# Patient Record
Sex: Female | Born: 1945 | Race: White | Hispanic: No | Smoking: Former smoker
Health system: Southern US, Community
[De-identification: ages and names within clinical notes are randomized; demographics above are authoritative.]

## PROBLEM LIST (undated history)

## (undated) DIAGNOSIS — E8801 Alpha-1-antitrypsin deficiency: Secondary | ICD-10-CM

## (undated) DIAGNOSIS — R413 Other amnesia: Secondary | ICD-10-CM

## (undated) DIAGNOSIS — I10 Essential (primary) hypertension: Secondary | ICD-10-CM

## (undated) HISTORY — PX: ELBOW SURGERY: SHX618

## (undated) HISTORY — PX: PORT WINE STAIN REMOVAL W/ LASER: SHX2245

## (undated) HISTORY — DX: Other amnesia: R41.3

## (undated) HISTORY — PX: APPENDECTOMY: SHX54

## (undated) HISTORY — DX: Alpha-1-antitrypsin deficiency: E88.01

---

## 2017-01-12 ENCOUNTER — Ambulatory Visit: Payer: Medicare Other

## 2017-01-12 ENCOUNTER — Other Ambulatory Visit (HOSPITAL_BASED_OUTPATIENT_CLINIC_OR_DEPARTMENT_OTHER): Payer: Medicare Other

## 2017-01-12 DIAGNOSIS — D508 Other iron deficiency anemias: Secondary | ICD-10-CM | POA: Diagnosis not present

## 2017-01-12 DIAGNOSIS — E8801 Alpha-1-antitrypsin deficiency: Secondary | ICD-10-CM | POA: Diagnosis not present

## 2017-01-12 LAB — COMPREHENSIVE METABOLIC PANEL
ALBUMIN: 4 g/dL (ref 3.5–5.0)
ALK PHOS: 65 U/L (ref 40–150)
ALT: 62 U/L — AB (ref 0–55)
ANION GAP: 9 meq/L (ref 3–11)
AST: 60 U/L — AB (ref 5–34)
BUN: 13.5 mg/dL (ref 7.0–26.0)
CALCIUM: 9.7 mg/dL (ref 8.4–10.4)
CHLORIDE: 106 meq/L (ref 98–109)
CO2: 27 mEq/L (ref 22–29)
CREATININE: 0.7 mg/dL (ref 0.6–1.1)
EGFR: 81 mL/min/{1.73_m2} — ABNORMAL LOW (ref >90)
Glucose: 97 mg/dl (ref 70–140)
Potassium: 4.2 mEq/L (ref 3.5–5.1)
Sodium: 142 mEq/L (ref 136–145)
TOTAL PROTEIN: 7.4 g/dL (ref 6.4–8.3)
Total Bilirubin: 0.49 mg/dL (ref 0.20–1.20)

## 2017-01-12 LAB — FERRITIN: FERRITIN: 157 ng/mL (ref 9–269)

## 2017-01-12 LAB — CBC WITH DIFFERENTIAL (CANCER CENTER ONLY)
BASO#: 0 10*3/uL (ref 0.0–0.2)
BASO%: 0.5 % (ref 0.0–2.0)
EOS%: 2.7 % (ref 0.0–7.0)
Eosinophils Absolute: 0.2 10*3/uL (ref 0.0–0.5)
HCT: 42.7 % (ref 34.8–46.6)
HGB: 14.5 g/dL (ref 11.6–15.9)
LYMPH#: 2.4 10*3/uL (ref 0.9–3.3)
LYMPH%: 38.6 % (ref 14.0–48.0)
MCH: 31.9 pg (ref 26.0–34.0)
MCHC: 34 g/dL (ref 32.0–36.0)
MCV: 94 fL (ref 81–101)
MONO#: 0.5 10*3/uL (ref 0.1–0.9)
MONO%: 8.1 % (ref 0.0–13.0)
NEUT#: 3.1 10*3/uL (ref 1.5–6.5)
NEUT%: 50.1 % (ref 39.6–80.0)
PLATELETS: 175 10*3/uL (ref 145–400)
RBC: 4.55 10*6/uL (ref 3.70–5.32)
RDW: 12.9 % (ref 11.1–15.7)
WBC: 6.2 10*3/uL (ref 3.9–10.0)

## 2017-01-12 LAB — IRON AND TIBC
%SAT: 33 % (ref 21–57)
Iron: 116 ug/dL (ref 41–142)
TIBC: 356 ug/dL (ref 236–444)
UIBC: 240 ug/dL (ref 120–384)

## 2017-01-12 LAB — LACTATE DEHYDROGENASE: LDH: 187 U/L (ref 125–245)

## 2017-01-13 ENCOUNTER — Ambulatory Visit (HOSPITAL_BASED_OUTPATIENT_CLINIC_OR_DEPARTMENT_OTHER): Payer: Medicare Other | Admitting: Hematology & Oncology

## 2017-01-13 ENCOUNTER — Encounter: Payer: Self-pay | Admitting: Hematology & Oncology

## 2017-01-13 VITALS — BP 167/65 | HR 66 | Temp 98.1°F | Resp 20 | Ht 61.5 in | Wt 112.0 lb

## 2017-01-13 DIAGNOSIS — E8801 Alpha-1-antitrypsin deficiency: Secondary | ICD-10-CM | POA: Diagnosis not present

## 2017-01-13 DIAGNOSIS — Z87891 Personal history of nicotine dependence: Secondary | ICD-10-CM | POA: Diagnosis not present

## 2017-01-13 DIAGNOSIS — R7989 Other specified abnormal findings of blood chemistry: Secondary | ICD-10-CM | POA: Diagnosis not present

## 2017-01-13 DIAGNOSIS — R634 Abnormal weight loss: Secondary | ICD-10-CM

## 2017-01-13 DIAGNOSIS — D510 Vitamin B12 deficiency anemia due to intrinsic factor deficiency: Secondary | ICD-10-CM

## 2017-01-13 DIAGNOSIS — D508 Other iron deficiency anemias: Secondary | ICD-10-CM

## 2017-01-13 HISTORY — DX: Alpha-1-antitrypsin deficiency: E88.01

## 2017-01-13 LAB — ERYTHROPOIETIN: ERYTHROPOIETIN: 5.5 m[IU]/mL (ref 2.6–18.5)

## 2017-01-13 NOTE — Progress Notes (Signed)
Referral MD  Reason for Referral: Alpha-1-antitrypsin deficiency -- weight loss  Chief Complaint  Patient presents with  . Initial Assessment    Alpha-1-antitrypsin defiency  : I am not feeling well. I am losing weight. Family doctor is not listening to me.  HPI: Veronica Meza is a very nice 71 year old white female. She has alpha-1 anti-trypsin (AAT) deficiency. She has a very strong family history of this. Her father had it. She has had several sisters have it who have died already. She is part of a registry.  Her phenotype is MZ.  She has a daughter who has is also.  The test and her family have been from liver disease, lymphoma, myeloma, and I think lung disease.  She is 71 years old. Most of her family members have died around that age. She is very nervous about her also having a problem that will lead to a short life. She apparently has lost about 16 pounds over the past several months. She does have thyroid issues. She is on Synthroid.  She used to smoke. She stopped about 4 years ago. She by has about a 40-pack-year history of tobacco use.  She does not drink.  There is no obvious occupational type exposures.  She feels that she may have some swollen lymph nodes. This is on her neck.  She had her last mammogram a few months ago. She thinks her last colonoscopy was about 5 years ago.  She is ever had any moles removed. Her ancestry is [place]. She has very fair skin.  She has had no obvious change in bowel or bladder habits. She gets fatigued quite easily.  She is not a vegetarian.  Overall, I said that her performance status is ECOG 1.   No past medical history on file.:  No past surgical history on file.:   Current Outpatient Prescriptions:  .  amLODipine (NORVASC) 5 MG tablet, 5 mg daily., Disp: , Rfl:  .  aspirin EC 81 MG tablet, Take 81 mg by mouth daily., Disp: , Rfl:  .  atorvastatin (LIPITOR) 40 MG tablet, Take 40 mg by mouth daily., Disp: , Rfl:  .   busPIRone (BUSPAR) 10 MG tablet, Take 10 mg by mouth 2 (two) times daily., Disp: , Rfl:  .  Calcium Carb-Cholecalciferol (CALCIUM-VITAMIN D) 500-200 MG-UNIT tablet, Take by mouth., Disp: , Rfl:  .  Co-Enzyme Q-10 30 MG CAPS, Take 30 mg by mouth daily., Disp: , Rfl:  .  cromolyn (OPTICROM) 4 % ophthalmic solution, PRN allergies., Disp: , Rfl:  .  DOCOSAHEXAENOIC ACID PO, Take by mouth daily., Disp: , Rfl:  .  levothyroxine (SYNTHROID, LEVOTHROID) 100 MCG tablet, 100 mcg daily., Disp: , Rfl:  .  lisinopril (PRINIVIL,ZESTRIL) 40 MG tablet, 40 mg daily., Disp: , Rfl:  .  loratadine (CLARITIN) 10 MG tablet, Take 10 mg by mouth., Disp: , Rfl:  .  mupirocin ointment (BACTROBAN) 2 %, Place 1 application into the nose., Disp: , Rfl:  .  Probiotic Product (PROBIOTIC-10 PO), Take by mouth., Disp: , Rfl: :  :  Allergies  Allergen Reactions  . Codeine Swelling  . Sulfa Antibiotics Itching and Nausea And Vomiting    Other reaction(s): Vomiting (intolerance) Other reaction(s): Vomiting (intolerance)  . Alendronate     Other reaction(s): GI Upset (intolerance)  . Erythromycin Base Nausea Only  . Hydrocodone-Acetaminophen Swelling  :  No family history on file.:  Social History   Social History  . Marital status: Married  Spouse name: N/A  . Number of children: N/A  . Years of education: N/A   Occupational History  . Not on file.   Social History Main Topics  . Smoking status: Former Smoker    Packs/day: 1.00    Years: 15.00    Types: Cigarettes    Quit date: 01/13/2010  . Smokeless tobacco: Never Used  . Alcohol use No  . Drug use: No  . Sexual activity: Not on file   Other Topics Concern  . Not on file   Social History Narrative  . No narrative on file  :  Pertinent items are noted in HPI.  Exam:  Well-developed and well-nourished white female in no obvious distress. Her vital signs show a temperature of 98.1. Pulse 65. Blood pressure 167/66. Weight is 112 pounds. Hent  exam shows no ocular or oral lesions. There are no palpable cervical or supraclavicular lymph nodes. I cannot palpate any obvious submandibular lymph nodes. Thyroid is nonpalpable. Lungs are clear bilaterally. Cardiac exam regular rate and rhythm with no murmurs, rubs or bruits. Abdomen is soft. She has good bowel sounds. There is no fluid wave. There is no palpable liver or spleen tip. Back exam shows no tenderness over the spine, ribs or hips. Extremities shows no clubbing, cyanosis or edema. Neurological exam shows no focal neurological deficits. Skin exam shows no rashes, ecchymoses or petechia.    Recent Labs  01/12/17 1055  WBC 6.2  HGB 14.5  HCT 42.7  PLT 175    Recent Labs  01/12/17 1055  NA 142  K 4.2  CO2 27  GLUCOSE 97  BUN 13.5  CREATININE 0.7  CALCIUM 9.7    Blood smear review:  None  Pathology: None     Assessment and Plan: Veronica Meza is a 71 year old white female. She has alpha-1 antitrypsin deficiency. She is heterozygous for this. She has the MZ phenotype.  I am not sure as to why she would be losing weight. IMs pot that she has smoked and does not have more in the way of lung problems. I'm sure that she has a pulmonologist that she follows.  I probably would do some baseline studies on her. I think she findings a CT of the neck to make sure there is no obvious adenopathy. I think she needs an ultrasound of her liver. Her LFTs are mildly elevated. I suppose this could be from the AAT.  I chest x-ray probably would not be a bad idea.  I am not sure why she has little stamina. I would think that her thyroid test probably need to be checked. I would imagine her family doctor could be doing this.  I just want to give her some peace of mind. She is thankful that we were able to see her to try to help her out. I know this is a difficult situation for her given the type of disease that she has and which there really is no treatment for. It looks like she has done  quite well overall with it.  I spent about an hour with she and her husband. They're both very nice.  I gave her a prayer blanket. She was very thankful for this.

## 2017-01-18 ENCOUNTER — Ambulatory Visit (HOSPITAL_BASED_OUTPATIENT_CLINIC_OR_DEPARTMENT_OTHER)
Admission: RE | Admit: 2017-01-18 | Discharge: 2017-01-18 | Disposition: A | Discharge status: Home / Self Care | Payer: Medicare Other | Source: Ambulatory Visit | Attending: Hematology & Oncology | Admitting: Hematology & Oncology

## 2017-01-18 ENCOUNTER — Encounter (HOSPITAL_BASED_OUTPATIENT_CLINIC_OR_DEPARTMENT_OTHER): Payer: Self-pay

## 2017-01-18 DIAGNOSIS — J432 Centrilobular emphysema: Secondary | ICD-10-CM | POA: Insufficient documentation

## 2017-01-18 DIAGNOSIS — R590 Localized enlarged lymph nodes: Secondary | ICD-10-CM | POA: Diagnosis not present

## 2017-01-18 DIAGNOSIS — R918 Other nonspecific abnormal finding of lung field: Secondary | ICD-10-CM | POA: Insufficient documentation

## 2017-01-18 DIAGNOSIS — E8801 Alpha-1-antitrypsin deficiency: Secondary | ICD-10-CM

## 2017-01-18 DIAGNOSIS — D508 Other iron deficiency anemias: Secondary | ICD-10-CM

## 2017-01-18 DIAGNOSIS — D510 Vitamin B12 deficiency anemia due to intrinsic factor deficiency: Secondary | ICD-10-CM

## 2017-01-18 DIAGNOSIS — I700 Atherosclerosis of aorta: Secondary | ICD-10-CM | POA: Insufficient documentation

## 2017-01-18 DIAGNOSIS — K769 Liver disease, unspecified: Secondary | ICD-10-CM | POA: Insufficient documentation

## 2017-01-18 DIAGNOSIS — Z87891 Personal history of nicotine dependence: Secondary | ICD-10-CM | POA: Diagnosis not present

## 2017-01-18 HISTORY — DX: Essential (primary) hypertension: I10

## 2017-01-18 LAB — HEMOCHROMATOSIS DNA-PCR(C282Y,H63D)

## 2017-01-18 MED ORDER — IOPAMIDOL (ISOVUE-300) INJECTION 61%
100.0000 mL | Freq: Once | INTRAVENOUS | Status: AC | PRN
  Administered 2017-01-18: 75 mL via INTRAVENOUS

## 2017-01-19 ENCOUNTER — Telehealth: Payer: Self-pay | Admitting: *Deleted

## 2017-01-19 NOTE — Telephone Encounter (Addendum)
Patient aware of results  ----- Message from [name], MD sent at 01/18/2017  4:52 PM EST ----- Call - CXR shows a lot of emphysema!!  NO obvious cancer!! pete

## 2017-02-05 ENCOUNTER — Other Ambulatory Visit (HOSPITAL_BASED_OUTPATIENT_CLINIC_OR_DEPARTMENT_OTHER): Payer: Medicare Other

## 2017-02-05 ENCOUNTER — Ambulatory Visit (HOSPITAL_BASED_OUTPATIENT_CLINIC_OR_DEPARTMENT_OTHER): Payer: Medicare Other | Admitting: Hematology & Oncology

## 2017-02-05 DIAGNOSIS — R5383 Other fatigue: Secondary | ICD-10-CM

## 2017-02-05 DIAGNOSIS — D508 Other iron deficiency anemias: Secondary | ICD-10-CM

## 2017-02-05 DIAGNOSIS — D510 Vitamin B12 deficiency anemia due to intrinsic factor deficiency: Secondary | ICD-10-CM

## 2017-02-05 DIAGNOSIS — E8801 Alpha-1-antitrypsin deficiency: Secondary | ICD-10-CM | POA: Diagnosis not present

## 2017-02-05 LAB — CMP (CANCER CENTER ONLY)
ALK PHOS: 75 U/L (ref 26–84)
ALT(SGPT): 67 U/L — ABNORMAL HIGH (ref 10–47)
AST: 65 U/L — AB (ref 11–38)
Albumin: 3.8 g/dL (ref 3.3–5.5)
BILIRUBIN TOTAL: 0.7 mg/dL (ref 0.20–1.60)
BUN: 7 mg/dL (ref 7–22)
CO2: 28 mEq/L (ref 18–33)
CREATININE: 1 mg/dL (ref 0.6–1.2)
Calcium: 9.3 mg/dL (ref 8.0–10.3)
Chloride: 104 mEq/L (ref 98–108)
GLUCOSE: 112 mg/dL (ref 73–118)
Potassium: 3.4 mEq/L (ref 3.3–4.7)
SODIUM: 142 meq/L (ref 128–145)
Total Protein: 6.7 g/dL (ref 6.4–8.1)

## 2017-02-05 LAB — CBC WITH DIFFERENTIAL (CANCER CENTER ONLY)
BASO#: 0 10*3/uL (ref 0.0–0.2)
BASO%: 0.4 % (ref 0.0–2.0)
EOS%: 2.5 % (ref 0.0–7.0)
Eosinophils Absolute: 0.2 10*3/uL (ref 0.0–0.5)
HCT: 39.5 % (ref 34.8–46.6)
HEMOGLOBIN: 13.5 g/dL (ref 11.6–15.9)
LYMPH#: 2.6 10*3/uL (ref 0.9–3.3)
LYMPH%: 32.4 % (ref 14.0–48.0)
MCH: 32.2 pg (ref 26.0–34.0)
MCHC: 34.2 g/dL (ref 32.0–36.0)
MCV: 94 fL (ref 81–101)
MONO#: 0.7 10*3/uL (ref 0.1–0.9)
MONO%: 8 % (ref 0.0–13.0)
NEUT%: 56.7 % (ref 39.6–80.0)
NEUTROS ABS: 4.6 10*3/uL (ref 1.5–6.5)
Platelets: 262 10*3/uL (ref 145–400)
RBC: 4.19 10*6/uL (ref 3.70–5.32)
RDW: 13.3 % (ref 11.1–15.7)
WBC: 8.1 10*3/uL (ref 3.9–10.0)

## 2017-02-05 LAB — TSH: TSH: 0.76 m[IU]/L (ref 0.308–3.960)

## 2017-02-05 NOTE — Progress Notes (Signed)
Hematology and Oncology Follow Up Visit  JONELLE BANN [id number] 02/01/1946 71 y.o. 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 Hockley is back for second office visit. We first saw her back in January. We did a bunch of baseline studies on her. Shockingly enough, we found that she does have hemochromatosis. I'm not sure if this is causing a mildly elevated liver tests. She is heterozygous for the major mutation- C282Y.  We first saw her, her ferritin was 137. Her iron saturation was only 33%.  We did go ahead and get an ultrasound of her abdomen. This showed some small gallstones. There was some liver abnormalities that suggested liver infiltration by fat.  We did do a CT scan of her neck. She had a slightly enlarged left submandibular gland. There is no lymphadenopathy area and there is no vascular abnormalities.  She is doing about the same. She's having some issues with diverticulitis right now. She feels a little bit bloated. She's having some loose stools. She is on probiotic.  She has had no fever. She has had no urinary issues. She's had no vomiting.  Overall, her performance status is ECOG 1.  Medications:  Current Outpatient Prescriptions:  .  amLODipine (NORVASC) 5 MG tablet, 5 mg daily., Disp: , Rfl:  .  aspirin EC 81 MG tablet, Take 81 mg by mouth daily., Disp: , Rfl:  .  atorvastatin (LIPITOR) 40 MG tablet, Take 40 mg by mouth daily., Disp: , Rfl:  .  busPIRone (BUSPAR) 10 MG tablet, Take 10 mg by mouth 2 (two) times daily., Disp: , Rfl:  .  Calcium Carb-Cholecalciferol (CALCIUM-VITAMIN D) 500-200 MG-UNIT tablet, Take by mouth., Disp: , Rfl:  .  Co-Enzyme Q-10 30 MG CAPS, Take 30 mg by mouth daily., Disp: , Rfl:  .  cromolyn (OPTICROM) 4 % ophthalmic solution, PRN allergies., Disp: , Rfl:    .  DOCOSAHEXAENOIC ACID PO, Take by mouth daily., Disp: , Rfl:  .  levothyroxine (SYNTHROID, LEVOTHROID) 100 MCG tablet, 100 mcg daily., Disp: , Rfl:  .  lisinopril (PRINIVIL,ZESTRIL) 40 MG tablet, 40 mg daily., Disp: , Rfl:  .  loratadine (CLARITIN) 10 MG tablet, Take 10 mg by mouth., Disp: , Rfl:  .  mupirocin ointment (BACTROBAN) 2 %, Place 1 application into the nose., Disp: , Rfl:  .  Probiotic Product (PROBIOTIC-10 PO), Take by mouth., Disp: , Rfl:   Allergies:  Allergies  Allergen Reactions  . Codeine Swelling  . Sulfa Antibiotics Itching and Nausea And Vomiting    Other reaction(s): Vomiting (intolerance) Other reaction(s): Vomiting (intolerance)  . Alendronate     Other reaction(s): GI Upset (intolerance)  . Erythromycin Base Nausea Only  . Hydrocodone-Acetaminophen Swelling    Past Medical History, Surgical history, Social history, and Family History were reviewed and updated.  Review of Systems: As above  Physical Exam:  weight is 114 lb (51.7 kg). Her oral temperature is 97.7 F (36.5 C). Her blood pressure is 153/55 (abnormal) and her pulse is 58 (abnormal). Her respiration is 16 and oxygen saturation is 98%.   Wt Readings from Last 3 Encounters:  02/05/17 114 lb (51.7 kg)  01/13/17 112 lb (50.8 kg)     Head and neck exam shows no ocular or oral lesions. There are no palpable cervical or supraclavicular lymph nodes. I  cannot palpate any obvious submandibular lymph nodes. Thyroid is nonpalpable. Lungs are clear bilaterally. Cardiac exam regular rate and rhythm with no murmurs, rubs or bruits. Abdomen is soft. She does seem slightly distended. She has good bowel sounds. There is no fluid wave. There is no palpable liver or spleen tip. Back exam shows no tenderness over the spine, ribs or hips. Extremities shows no clubbing, cyanosis or edema. Neurological exam shows no focal neurological deficits. Skin exam shows no rashes, ecchymoses or petechia.   Lab Results   Component Value Date   WBC 8.1 02/05/2017   HGB 13.5 02/05/2017   HCT 39.5 02/05/2017   MCV 94 02/05/2017   PLT 262 02/05/2017     Chemistry      Component Value Date/Time   NA 142 02/05/2017 0750   NA 142 01/12/2017 1055   K 3.4 02/05/2017 0750   K 4.2 01/12/2017 1055   CL 104 02/05/2017 0750   CO2 28 02/05/2017 0750   CO2 27 01/12/2017 1055   BUN 7 02/05/2017 0750   BUN 13.5 01/12/2017 1055   CREATININE 1.0 02/05/2017 0750   CREATININE 0.7 01/12/2017 1055      Component Value Date/Time   CALCIUM 9.3 02/05/2017 0750   CALCIUM 9.7 01/12/2017 1055   ALKPHOS 75 02/05/2017 0750   ALKPHOS 65 01/12/2017 1055   AST 65 (H) 02/05/2017 0750   AST 60 (H) 01/12/2017 1055   ALT 67 (H) 02/05/2017 0750   ALT 62 (H) 01/12/2017 1055   BILITOT 0.70 02/05/2017 0750   BILITOT 0.49 01/12/2017 1055         Impression and Plan: Ms. Kloos is a 71 year old white female. She has alpha-1 anti-trypsin deficiency. I am not sure how much of the liver function tests are because of this or because of hemochromatosis.  Again she only has a heterozygote inheritance of the hemochromatosis. Her ferritin level was not that bad. Her iron saturation definitely is not bad.  On my sure she can really tolerate a lot of phlebotomies. As such, I think we have to just hold off on phlebotomizing her. I think we have little bit of flexibility with respect to her iron studies.  I will like to get her back in 6 weeks. We'll see what her iron studies show. Again, I think we do have some flexibility in our phlebotomy protocol.  I spent about 25 minutes with her today. I went over all of her lab work. I explained to her what hemochromatosis was.   [name], MD 3/2/20188:57 AM

## 2017-02-06 LAB — VITAMIN B12: VITAMIN B 12: 704 pg/mL (ref 232–1245)

## 2017-02-11 ENCOUNTER — Telehealth: Payer: Self-pay | Admitting: Hematology & Oncology

## 2017-02-11 NOTE — Telephone Encounter (Signed)
Called patient to schedule 6 week follow up appt with Dr. Myna Hidalgo. Patient stated that she has had a death in the family, and will call back to schedule at another time.        [facility] MARCH 2018  [facility]

## 2017-03-12 ENCOUNTER — Ambulatory Visit (HOSPITAL_BASED_OUTPATIENT_CLINIC_OR_DEPARTMENT_OTHER): Payer: Medicare Other | Admitting: Family

## 2017-03-12 ENCOUNTER — Other Ambulatory Visit (HOSPITAL_BASED_OUTPATIENT_CLINIC_OR_DEPARTMENT_OTHER): Payer: Medicare Other

## 2017-03-12 DIAGNOSIS — R634 Abnormal weight loss: Secondary | ICD-10-CM

## 2017-03-12 DIAGNOSIS — K760 Fatty (change of) liver, not elsewhere classified: Secondary | ICD-10-CM

## 2017-03-12 DIAGNOSIS — R5383 Other fatigue: Secondary | ICD-10-CM

## 2017-03-12 DIAGNOSIS — E8801 Alpha-1-antitrypsin deficiency: Secondary | ICD-10-CM

## 2017-03-12 LAB — CMP (CANCER CENTER ONLY)
ALT: 68 U/L — AB (ref 10–47)
AST: 75 U/L — ABNORMAL HIGH (ref 11–38)
Albumin: 3.8 g/dL (ref 3.3–5.5)
Alkaline Phosphatase: 78 U/L (ref 26–84)
BUN: 12 mg/dL (ref 7–22)
CALCIUM: 9.8 mg/dL (ref 8.0–10.3)
CHLORIDE: 104 meq/L (ref 98–108)
CO2: 28 mEq/L (ref 18–33)
Creat: 0.9 mg/dl (ref 0.6–1.2)
GLUCOSE: 102 mg/dL (ref 73–118)
POTASSIUM: 4.2 meq/L (ref 3.3–4.7)
Sodium: 143 mEq/L (ref 128–145)
TOTAL PROTEIN: 7.5 g/dL (ref 6.4–8.1)
Total Bilirubin: 0.7 mg/dl (ref 0.20–1.60)

## 2017-03-12 LAB — CBC WITH DIFFERENTIAL (CANCER CENTER ONLY)
BASO#: 0 10*3/uL (ref 0.0–0.2)
BASO%: 0.3 % (ref 0.0–2.0)
EOS ABS: 0.2 10*3/uL (ref 0.0–0.5)
EOS%: 2.9 % (ref 0.0–7.0)
HEMATOCRIT: 40.7 % (ref 34.8–46.6)
HGB: 13.9 g/dL (ref 11.6–15.9)
LYMPH#: 2.3 10*3/uL (ref 0.9–3.3)
LYMPH%: 38.7 % (ref 14.0–48.0)
MCH: 31.7 pg (ref 26.0–34.0)
MCHC: 34.2 g/dL (ref 32.0–36.0)
MCV: 93 fL (ref 81–101)
MONO#: 0.6 10*3/uL (ref 0.1–0.9)
MONO%: 9.6 % (ref 0.0–13.0)
NEUT%: 48.5 % (ref 39.6–80.0)
NEUTROS ABS: 2.9 10*3/uL (ref 1.5–6.5)
PLATELETS: 170 10*3/uL (ref 145–400)
RBC: 4.38 10*6/uL (ref 3.70–5.32)
RDW: 13.2 % (ref 11.1–15.7)
WBC: 5.9 10*3/uL (ref 3.9–10.0)

## 2017-03-12 LAB — IRON AND TIBC
%SAT: 38 % (ref 21–57)
Iron: 132 ug/dL (ref 41–142)
TIBC: 346 ug/dL (ref 236–444)
UIBC: 214 ug/dL (ref 120–384)

## 2017-03-12 LAB — FERRITIN: FERRITIN: 156 ng/mL (ref 9–269)

## 2017-03-12 NOTE — Progress Notes (Signed)
Hematology and Oncology Follow Up Visit  AVANGELINA FLIGHT [id number] [birth year] 71 y.o. 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 Stith is here today with her grandson for follow-up. She is still having a good bit of fatigue and occasional dizziness. She has also noticed some short term memory issues and "brain fog". No falls or syncope.  She had both an endoscopy and colonoscopy yesterday and was found to have diverticulosis and as well as an ulcer. Full report is not yet available on care everywhere. She states that she was started on a PPI and thinks it is Dexilant.  She denies having any episodes of bleeding, bruising or petechiae. Hgb is stable at 13.9 with an MCV of 93. Iron studies are pending. So far, she has not required a phlebotomy. LFT's are mildly elevated but appear to be stable.  TSH level was therapeutic at 0.760 earlier in March. She takes Synthroid 100 mcg PO daily as prescribed.  No fever, chills, n/v, cough, rash, SOB, chest pain, palpitations, abdominal pain or changes in bowel or bladder habits.  No swelling, tenderness, numbness or tingling in her extremities. She has pain in the balls of her feet and wears sandals most of the time. She does not have a podiatrist at this time but will check into this.  She has maintained a good appetite and is trying to eat healthier avoiding iron rich and fatty foods. She is staying well hydrated. She states that her weight has dropped from the 120's now to 108. GI is following her for this and plans to see her back in a few weeks. So far, they have not uncovered the cause.  She plans to start walking for exercise.   ECOG Performance Status: 1 - Symptomatic but completely ambulatory  Medications:  Allergies as of 03/12/2017      Reactions   Codeine Swelling   Sulfa Antibiotics Itching, Nausea And Vomiting   Other reaction(s): Vomiting (intolerance) Other reaction(s): Vomiting (intolerance)   Alendronate    Other reaction(s): GI Upset (intolerance)   Erythromycin Base Nausea Only   Hydrocodone-acetaminophen Swelling      Medication List       Accurate as of 03/12/17  8:41 AM. Always use your most recent med list.          amLODipine 5 MG tablet Commonly known as:  NORVASC 5 mg daily.   aspirin EC 81 MG tablet Take 81 mg by mouth daily.   atorvastatin 40 MG tablet Commonly known as:  LIPITOR Take 40 mg by mouth daily.   busPIRone 10 MG tablet Commonly known as:  BUSPAR Take 10 mg by mouth 2 (two) times daily.   calcium-vitamin D 500-200 MG-UNIT tablet Take by mouth.   Co-Enzyme Q-10 30 MG Caps Take 30 mg by mouth daily.   cromolyn 4 % ophthalmic solution Commonly known as:  OPTICROM PRN allergies.   DOCOSAHEXAENOIC ACID PO Take by mouth daily.   levothyroxine 100 MCG tablet Commonly known as:  SYNTHROID, LEVOTHROID 100 mcg daily.   lisinopril 40 MG tablet Commonly known as:  PRINIVIL,ZESTRIL 40 mg daily.   loratadine 10 MG tablet Commonly known as:  CLARITIN Take 10 mg by mouth.   mupirocin ointment 2 % Commonly known as:  BACTROBAN Place 1 application into the nose.   PROBIOTIC-10 PO Take by mouth.  Allergies:  Allergies  Allergen Reactions  . Codeine Swelling  . Sulfa Antibiotics Itching and Nausea And Vomiting    Other reaction(s): Vomiting (intolerance) Other reaction(s): Vomiting (intolerance)  . Alendronate     Other reaction(s): GI Upset (intolerance)  . Erythromycin Base Nausea Only  . Hydrocodone-Acetaminophen Swelling    Past Medical History, Surgical history, Social history, and Family History were reviewed and updated.  Review of Systems: All other 10 point review of systems is negative.   Physical Exam:  vitals were not taken for this visit.  Wt Readings from Last 3  Encounters:  02/05/17 114 lb (51.7 kg)  01/13/17 112 lb (50.8 kg)    Ocular: Sclerae unicteric, pupils equal, round and reactive to light Ear-nose-throat: Oropharynx clear, dentition fair, thyroid is nonpalpable  Lymphatic: No cervical, supraclavicular or axillary adenopathy Lungs no rales or rhonchi, good excursion bilaterally Heart regular rate and rhythm, no murmur appreciated Abd soft, nontender, positive bowel sounds, no liver or spleen tip palpated on exam, no fluid wave MSK no focal spinal tenderness, no joint edema Neuro: non-focal, well-oriented, appropriate affect Breasts: Deferred  Lab Results  Component Value Date   WBC 5.9 03/12/2017   HGB 13.9 03/12/2017   HCT 40.7 03/12/2017   MCV 93 03/12/2017   PLT 170 03/12/2017   Lab Results  Component Value Date   FERRITIN 157 01/12/2017   IRON 116 01/12/2017   TIBC 356 01/12/2017   UIBC 240 01/12/2017   IRONPCTSAT 33 01/12/2017   Lab Results  Component Value Date   RBC 4.38 03/12/2017   No results found for: KPAFRELGTCHN, LAMBDASER, KAPLAMBRATIO No results found for: IGGSERUM, IGA, IGMSERUM No results found for: Marda Stalker, SPEI   Chemistry      Component Value Date/Time   NA 142 02/05/2017 0750   NA 142 01/12/2017 1055   K 3.4 02/05/2017 0750   K 4.2 01/12/2017 1055   CL 104 02/05/2017 0750   CO2 28 02/05/2017 0750   CO2 27 01/12/2017 1055   BUN 7 02/05/2017 0750   BUN 13.5 01/12/2017 1055   CREATININE 1.0 02/05/2017 0750   CREATININE 0.7 01/12/2017 1055      Component Value Date/Time   CALCIUM 9.3 02/05/2017 0750   CALCIUM 9.7 01/12/2017 1055   ALKPHOS 75 02/05/2017 0750   ALKPHOS 65 01/12/2017 1055   AST 65 (H) 02/05/2017 0750   AST 60 (H) 01/12/2017 1055   ALT 67 (H) 02/05/2017 0750   ALT 62 (H) 01/12/2017 1055   BILITOT 0.70 02/05/2017 0750   BILITOT 0.49 01/12/2017 1055     Impression and Plan: Ms. Molony is a very pleasant 71 yo  caucasian female with alpha-1 anti-trypsin deficiency as well as the heterozygote inheritance of hemochromatosis. [place] in February showed a fatty liver. She has adjusted her diet to avoid fat and iron rich foods. She is still having quite a bit of fatigue and some weight loss.   Her iron studies have remained stable and so far she has not required a phlebotomy. Levels for today are pending.  She has an appointment with GI again in a few weeks. They continue to monitor her weight loss.  We will plan to see her back again in 6 weeks for repeat lab work and follow-up.  I spent at least 25 minutes face to face with the patient and her grandson counseling.  Both she and her family know to contact our office with any questions or  concerns. We can certainly see her sooner if need be.   Verdie Mosher, NP 4/6/20188:41 AM

## 2017-03-25 ENCOUNTER — Ambulatory Visit: Payer: Medicare Other | Admitting: Hematology & Oncology

## 2017-03-25 ENCOUNTER — Other Ambulatory Visit: Payer: Medicare Other

## 2017-04-29 ENCOUNTER — Other Ambulatory Visit (HOSPITAL_BASED_OUTPATIENT_CLINIC_OR_DEPARTMENT_OTHER): Payer: Medicare Other

## 2017-04-29 ENCOUNTER — Ambulatory Visit (HOSPITAL_BASED_OUTPATIENT_CLINIC_OR_DEPARTMENT_OTHER): Payer: Medicare Other | Admitting: Hematology & Oncology

## 2017-04-29 DIAGNOSIS — R5383 Other fatigue: Secondary | ICD-10-CM

## 2017-04-29 DIAGNOSIS — E8801 Alpha-1-antitrypsin deficiency: Secondary | ICD-10-CM | POA: Diagnosis not present

## 2017-04-29 DIAGNOSIS — K760 Fatty (change of) liver, not elsewhere classified: Secondary | ICD-10-CM

## 2017-04-29 LAB — CBC WITH DIFFERENTIAL (CANCER CENTER ONLY)
BASO#: 0.1 10*3/uL (ref 0.0–0.2)
BASO%: 0.5 % (ref 0.0–2.0)
EOS%: 0.1 % (ref 0.0–7.0)
Eosinophils Absolute: 0 10*3/uL (ref 0.0–0.5)
HEMATOCRIT: 41 % (ref 34.8–46.6)
HGB: 13.8 g/dL (ref 11.6–15.9)
LYMPH#: 2.2 10*3/uL (ref 0.9–3.3)
LYMPH%: 18 % (ref 14.0–48.0)
MCH: 31.7 pg (ref 26.0–34.0)
MCHC: 33.7 g/dL (ref 32.0–36.0)
MCV: 94 fL (ref 81–101)
MONO#: 1 10*3/uL — ABNORMAL HIGH (ref 0.1–0.9)
MONO%: 8.1 % (ref 0.0–13.0)
NEUT#: 8.8 10*3/uL — ABNORMAL HIGH (ref 1.5–6.5)
NEUT%: 73.3 % (ref 39.6–80.0)
Platelets: 216 10*3/uL (ref 145–400)
RBC: 4.36 10*6/uL (ref 3.70–5.32)
RDW: 13.8 % (ref 11.1–15.7)
WBC: 12 10*3/uL — ABNORMAL HIGH (ref 3.9–10.0)

## 2017-04-29 LAB — COMPREHENSIVE METABOLIC PANEL
ALBUMIN: 4 g/dL (ref 3.5–5.0)
ALT: 66 U/L — ABNORMAL HIGH (ref 0–55)
AST: 58 U/L — AB (ref 5–34)
Alkaline Phosphatase: 66 U/L (ref 40–150)
Anion Gap: 8 mEq/L (ref 3–11)
BILIRUBIN TOTAL: 0.46 mg/dL (ref 0.20–1.20)
BUN: 18.8 mg/dL (ref 7.0–26.0)
CO2: 25 mEq/L (ref 22–29)
CREATININE: 0.8 mg/dL (ref 0.6–1.1)
Calcium: 10.1 mg/dL (ref 8.4–10.4)
Chloride: 106 mEq/L (ref 98–109)
EGFR: 73 mL/min/{1.73_m2} — ABNORMAL LOW (ref >90)
GLUCOSE: 92 mg/dL (ref 70–140)
Potassium: 4.1 mEq/L (ref 3.5–5.1)
SODIUM: 139 meq/L (ref 136–145)
TOTAL PROTEIN: 7.2 g/dL (ref 6.4–8.3)

## 2017-04-29 LAB — FERRITIN: Ferritin: 115 ng/ml (ref 9–269)

## 2017-04-29 LAB — IRON AND TIBC
%SAT: 36 % (ref 21–57)
IRON: 122 ug/dL (ref 41–142)
TIBC: 341 ug/dL (ref 236–444)
UIBC: 219 ug/dL (ref 120–384)

## 2017-04-29 NOTE — Progress Notes (Signed)
Hematology and Oncology Follow Up Visit  Veronica Meza [id number] 11-12-1946 71 y.o. 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 She has she's feeling pretty well. We last saw her back in early April.  We've been watching her ferritins and iron studies. Back in April, her ferritin was 156 with iron saturation of 38%.  She does have some fatigue. She has had no change in her medications.  She's had no fever. She's had no nausea or vomiting. She's had no bleeding. She's had no rashes.  She is trying to exercise. She is doing some walking.   She might be going to the beach this summer. With her very fair skin, told her that she has to wear a lot of sunscreen and drink a lot of water.  ECOG Performance Status: 1 - Symptomatic but completely ambulatory  Medications:  Allergies as of 04/29/2017      Reactions   Codeine Swelling   Sulfa Antibiotics Itching, Nausea And Vomiting   Other reaction(s): Vomiting (intolerance) Other reaction(s): Vomiting (intolerance)   Alendronate    Other reaction(s): GI Upset (intolerance)   Erythromycin Base Nausea Only   Hydrocodone-acetaminophen Swelling      Medication List       Accurate as of 04/29/17 11:17 AM. Always use your most recent med list.          amLODipine 5 MG tablet Commonly known as:  NORVASC 5 mg daily.   aspirin EC 81 MG tablet Take 81 mg by mouth daily.   atorvastatin 40 MG tablet Commonly known as:  LIPITOR Take 40 mg by mouth daily.   busPIRone 10 MG tablet Commonly known as:  BUSPAR Take 10 mg by mouth 2 (two) times daily.   calcium-vitamin D 500-200 MG-UNIT tablet Take by mouth.   Co-Enzyme Q-10 30 MG Caps Take 30 mg by mouth daily.   cromolyn 4 % ophthalmic solution Commonly known as:   OPTICROM PRN allergies.   DOCOSAHEXAENOIC ACID PO Take by mouth daily.   levothyroxine 100 MCG tablet Commonly known as:  SYNTHROID, LEVOTHROID 100 mcg daily.   lisinopril 40 MG tablet Commonly known as:  PRINIVIL,ZESTRIL 40 mg daily.   loratadine 10 MG tablet Commonly known as:  CLARITIN Take 10 mg by mouth.   mupirocin ointment 2 % Commonly known as:  BACTROBAN Place 1 application into the nose.   PROBIOTIC-10 PO Take by mouth.       Allergies:  Allergies  Allergen Reactions  . Codeine Swelling  . Sulfa Antibiotics Itching and Nausea And Vomiting    Other reaction(s): Vomiting (intolerance) Other reaction(s): Vomiting (intolerance)  . Alendronate     Other reaction(s): GI Upset (intolerance)  . Erythromycin Base Nausea Only  . Hydrocodone-Acetaminophen Swelling    Past Medical History, Surgical history, Social history, and Family History were reviewed and updated.  Review of Systems: All other 10 point review of systems is negative.   Physical Exam:  weight is 108 lb (49 kg). Her oral temperature is 98.1 F (36.7 C). Her blood pressure is 154/54 (abnormal) and her pulse is 55 (abnormal). Her respiration is 16 and oxygen saturation is 99%.   Wt Readings from Last 3 Encounters:  04/29/17 108 lb (49 kg)  03/12/17 108 lb (49 kg)  02/05/17 114 lb (51.7 kg)  Head and neck exam shows no ocular or oral lesions. There are no palpable cervical or supraclavicular lymph nodes. I cannot palpate any obvious submandibular lymph nodes. Thyroid is nonpalpable. Lungs are clear bilaterally. Cardiac exam regular rate and rhythm with no murmurs, rubs or bruits. Abdomen is soft. She does seem slightly distended. She has good bowel sounds. There is no fluid wave. There is no palpable liver or spleen tip. Back exam shows no tenderness over the spine, ribs or hips. Extremities shows no clubbing, cyanosis or edema. Neurological exam shows no focal neurological deficits. Skin exam  shows no rashes, ecchymoses or petechia.   Lab Results  Component Value Date   WBC 12.0 (H) 04/29/2017   HGB 13.8 04/29/2017   HCT 41.0 04/29/2017   MCV 94 04/29/2017   PLT 216 04/29/2017   Lab Results  Component Value Date   FERRITIN 156 03/12/2017   IRON 132 03/12/2017   TIBC 346 03/12/2017   UIBC 214 03/12/2017   IRONPCTSAT 38 03/12/2017   Lab Results  Component Value Date   RBC 4.36 04/29/2017   No results found for: KPAFRELGTCHN, LAMBDASER, KAPLAMBRATIO No results found for: IGGSERUM, IGA, IGMSERUM No results found for: Marda Stalker, SPEI   Chemistry      Component Value Date/Time   NA 143 03/12/2017 0819   NA 142 01/12/2017 1055   K 4.2 03/12/2017 0819   K 4.2 01/12/2017 1055   CL 104 03/12/2017 0819   CO2 28 03/12/2017 0819   CO2 27 01/12/2017 1055   BUN 12 03/12/2017 0819   BUN 13.5 01/12/2017 1055   CREATININE 0.9 03/12/2017 0819   CREATININE 0.7 01/12/2017 1055      Component Value Date/Time   CALCIUM 9.8 03/12/2017 0819   CALCIUM 9.7 01/12/2017 1055   ALKPHOS 78 03/12/2017 0819   ALKPHOS 65 01/12/2017 1055   AST 75 (H) 03/12/2017 0819   AST 60 (H) 01/12/2017 1055   ALT 68 (H) 03/12/2017 0819   ALT 62 (H) 01/12/2017 1055   BILITOT 0.70 03/12/2017 0819   BILITOT 0.49 01/12/2017 1055     Impression and Plan: Veronica Meza is a very pleasant 71 yo caucasian female with alpha-1 anti-trypsin deficiency as well as the heterozygote inheritance of hemochromatosis.   Her iron studies today social that her ferritin is above 100. Ferritin is 115. Her iron saturation is 36%.  Her liver function tests are slightly elevated but not going up. She had an ultrasound done back in February of her liver. She has a fatty liver. I do not think this is anything related to hemochromatosis. I don't think it is related to her alpha-1 antitrypsin deficiency.  We'll see about getting her phlebotomized.  We will plan to see  her back in another 4 months    [name], MD 5/24/201811:17 AM

## 2017-05-04 ENCOUNTER — Telehealth: Payer: Self-pay | Admitting: *Deleted

## 2017-05-04 NOTE — Telephone Encounter (Addendum)
-----   Message from [name], MD sent at 04/30/2017  7:39 AM EDT ----- Please call and tell her that the ferritin is 115. We need to set her up for a phlebotomy. I want to make sure her ferritin is below 100 so that there is no potential for liver issues or liver damage. This can be done in one or 2 weeks. Appointment made with patient for Monday June 4 at noon

## 2017-05-10 ENCOUNTER — Ambulatory Visit (HOSPITAL_BASED_OUTPATIENT_CLINIC_OR_DEPARTMENT_OTHER): Payer: Medicare Other

## 2017-05-10 ENCOUNTER — Other Ambulatory Visit: Payer: Self-pay | Admitting: Family

## 2017-05-10 NOTE — Progress Notes (Signed)
Veronica Meza presents today for phlebotomy per MD orders. Phlebotomy procedure started at 1155 and ended at 1215. 518 grams removed from lt AC using 22g IV catheter. Patient observed for 30 minutes after procedure without any incident. Patient tolerated procedure well. IV needle removed intact.

## 2017-05-10 NOTE — Patient Instructions (Signed)
Therapeutic Phlebotomy Therapeutic phlebotomy is the controlled removal of blood from a person's body for the purpose of treating a medical condition. The procedure is similar to donating blood. Usually, about a pint (470 mL, or 0.47L) of blood is removed. The average adult has 9-12 pints (4.3-5.7 L) of blood. Therapeutic phlebotomy may be used to treat the following medical conditions:  Hemochromatosis. This is a condition in which the blood contains too much iron.  Polycythemia vera. This is a condition in which the blood contains too many red blood cells.  Porphyria cutanea tarda. This is a disease in which an important part of hemoglobin is not made properly. It results in the buildup of abnormal amounts of porphyrins in the body.  Sickle cell disease. This is a condition in which the red blood cells form an abnormal crescent shape rather than a round shape.  Tell a health care provider about:  Any allergies you have.  All medicines you are taking, including vitamins, herbs, eye drops, creams, and over-the-counter medicines.  Any problems you or family members have had with anesthetic medicines.  Any blood disorders you have.  Any surgeries you have had.  Any medical conditions you have. What are the risks? Generally, this is a safe procedure. However, problems may occur, including:  Nausea or light-headedness.  Low blood pressure.  Soreness, bleeding, swelling, or bruising at the needle insertion site.  Infection.  What happens before the procedure?  Follow instructions from your health care provider about eating or drinking restrictions.  Ask your health care provider about changing or stopping your regular medicines. This is especially important if you are taking diabetes medicines or blood thinners.  Wear clothing with sleeves that can be raised above the elbow.  Plan to have someone take you home after the procedure.  You may have a blood sample taken. What  happens during the procedure?  A needle will be inserted into one of your veins.  Tubing and a collection bag will be attached to that needle.  Blood will flow through the needle and tubing into the collection bag.  You may be asked to open and close your hand slowly and continually during the entire collection.  After the specified amount of blood has been removed from your body, the collection bag and tubing will be clamped.  The needle will be removed from your vein.  Pressure will be held on the site of the needle insertion to stop the bleeding.  A bandage (dressing) will be placed over the needle insertion site. The procedure may vary among health care providers and hospitals. What happens after the procedure?  Your recovery will be assessed and monitored.  You can return to your normal activities as directed by your health care provider. This information is not intended to replace advice given to you by your health care provider. Make sure you discuss any questions you have with your health care provider. Document Released: 04/27/2011 Document Revised: 07/25/2016 Document Reviewed: 11/19/2014 Elsevier Interactive Patient Education  2018 [employer] Inc[employer]  

## 2017-08-31 ENCOUNTER — Ambulatory Visit (HOSPITAL_BASED_OUTPATIENT_CLINIC_OR_DEPARTMENT_OTHER): Payer: Medicare Other | Admitting: Hematology & Oncology

## 2017-08-31 ENCOUNTER — Other Ambulatory Visit (HOSPITAL_BASED_OUTPATIENT_CLINIC_OR_DEPARTMENT_OTHER): Payer: Medicare Other

## 2017-08-31 VITALS — BP 177/64 | HR 55 | Temp 98.2°F | Resp 16 | Wt 111.0 lb

## 2017-08-31 DIAGNOSIS — E8801 Alpha-1-antitrypsin deficiency: Secondary | ICD-10-CM | POA: Diagnosis not present

## 2017-08-31 LAB — IRON AND TIBC
%SAT: 34 % (ref 21–57)
IRON: 116 ug/dL (ref 41–142)
TIBC: 340 ug/dL (ref 236–444)
UIBC: 224 ug/dL (ref 120–384)

## 2017-08-31 LAB — CMP (CANCER CENTER ONLY)
ALK PHOS: 75 U/L (ref 26–84)
ALT: 58 U/L — AB (ref 10–47)
AST: 56 U/L — ABNORMAL HIGH (ref 11–38)
Albumin: 3.7 g/dL (ref 3.3–5.5)
BUN: 12 mg/dL (ref 7–22)
CALCIUM: 9.2 mg/dL (ref 8.0–10.3)
CO2: 30 mEq/L (ref 18–33)
Chloride: 100 mEq/L (ref 98–108)
Creat: 1 mg/dl (ref 0.6–1.2)
Glucose, Bld: 105 mg/dL (ref 73–118)
POTASSIUM: 4 meq/L (ref 3.3–4.7)
Sodium: 141 mEq/L (ref 128–145)
TOTAL PROTEIN: 7.1 g/dL (ref 6.4–8.1)
Total Bilirubin: 0.7 mg/dl (ref 0.20–1.60)

## 2017-08-31 LAB — CBC WITH DIFFERENTIAL (CANCER CENTER ONLY)
BASO#: 0.1 10*3/uL (ref 0.0–0.2)
BASO%: 0.9 % (ref 0.0–2.0)
EOS%: 2.3 % (ref 0.0–7.0)
Eosinophils Absolute: 0.1 10*3/uL (ref 0.0–0.5)
HEMATOCRIT: 40.4 % (ref 34.8–46.6)
HEMOGLOBIN: 13.5 g/dL (ref 11.6–15.9)
LYMPH#: 2 10*3/uL (ref 0.9–3.3)
LYMPH%: 37 % (ref 14.0–48.0)
MCH: 31.7 pg (ref 26.0–34.0)
MCHC: 33.4 g/dL (ref 32.0–36.0)
MCV: 95 fL (ref 81–101)
MONO#: 0.4 10*3/uL (ref 0.1–0.9)
MONO%: 7.7 % (ref 0.0–13.0)
NEUT#: 2.8 10*3/uL (ref 1.5–6.5)
NEUT%: 52.1 % (ref 39.6–80.0)
Platelets: 154 10*3/uL (ref 145–400)
RBC: 4.26 10*6/uL (ref 3.70–5.32)
RDW: 13.4 % (ref 11.1–15.7)
WBC: 5.3 10*3/uL (ref 3.9–10.0)

## 2017-08-31 LAB — LACTATE DEHYDROGENASE: LDH: 191 U/L (ref 125–245)

## 2017-08-31 LAB — FERRITIN: Ferritin: 68 ng/ml (ref 9–269)

## 2017-08-31 NOTE — Progress Notes (Signed)
Hematology and Oncology Follow Up Visit  Veronica Meza [id number] 1946/02/23 71 y.o. 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 is here today for follow-up. She has she's feeling pretty well. We last saw her back in May.  We've been watching her ferritins and iron studies. Back inmayil, her ferritin was 115 with iron saturation of 36%.  she had a nice summer. She and her family went to [facility]. she was very judicious and used a lot of sunscreen.  they actually are going up to [place] next weekend for a wedding. She is looking forward to this.  She does have some fatigue. She has had no change in her medications.  She's had no fever. She's had no nausea or vomiting. She's had no bleeding. She's had no rashes.  She is trying to exercise. She is doing some walking.    Overall, her performance status is ECOG 1.  Medications:  Allergies as of 08/31/2017      Reactions   Codeine Swelling   Sulfa Antibiotics Itching, Nausea And Vomiting   Other reaction(s): Vomiting (intolerance) Other reaction(s): Vomiting (intolerance)   Alendronate    Other reaction(s): GI Upset (intolerance)   Erythromycin Base Nausea Only   Hydrocodone-acetaminophen Swelling      Medication List       Accurate as of 08/31/17  2:10 PM. Always use your most recent med list.          amLODipine 5 MG tablet Commonly known as:  NORVASC 5 mg daily.   aspirin EC 81 MG tablet Take 81 mg by mouth daily.   atorvastatin 40 MG tablet Commonly known as:  LIPITOR Take 40 mg by mouth daily.   busPIRone 10 MG tablet Commonly known as:  BUSPAR Take 10 mg by mouth 2 (two) times daily.   calcium-vitamin D 500-200 MG-UNIT tablet Take by mouth.   Co-Enzyme Q-10 30 MG Caps Take 30 mg by mouth daily.   cromolyn 4 %  ophthalmic solution Commonly known as:  OPTICROM PRN allergies.   DOCOSAHEXAENOIC ACID PO Take by mouth daily.   levothyroxine 100 MCG tablet Commonly known as:  SYNTHROID, LEVOTHROID 100 mcg daily.   lisinopril 40 MG tablet Commonly known as:  PRINIVIL,ZESTRIL 40 mg daily.   loratadine 10 MG tablet Commonly known as:  CLARITIN Take 10 mg by mouth.   mupirocin ointment 2 % Commonly known as:  BACTROBAN Place 1 application into the nose.   PROBIOTIC-10 PO Take by mouth.            Discharge Care Instructions        Start     Ordered   08/31/17 0000  CBC with Differential ([facility])     08/31/17 1113   08/31/17 0000  CMP STAT ([facility] only)     08/31/17 1113   08/31/17 0000  Ferritin     08/31/17 1113   08/31/17 0000  Iron and TIBC     08/31/17 1113      Allergies:  Allergies  Allergen Reactions  . Codeine Swelling  . Sulfa Antibiotics Itching and Nausea And Vomiting    Other reaction(s): Vomiting (intolerance) Other reaction(s): Vomiting (intolerance)  . Alendronate     Other reaction(s): GI Upset (intolerance)  . Erythromycin Base Nausea Only  . Hydrocodone-Acetaminophen Swelling    Past  Medical History, Surgical history, Social history, and Family History were reviewed and updated.  Review of Systems: as stated in the interim history  Physical Exam:  weight is 111 lb (50.3 kg). Her oral temperature is 98.2 F (36.8 C). Her blood pressure is 177/64 (abnormal) and her pulse is 55 (abnormal). Her respiration is 16 and oxygen saturation is 98%.   Wt Readings from Last 3 Encounters:  08/31/17 111 lb (50.3 kg)  04/29/17 108 lb (49 kg)  03/12/17 108 lb (49 kg)    I saw and examined Veronica Meza. Her exam is as noted below with appropriate changes:  Head and neck exam shows no ocular or oral lesions. There are no palpable cervical or supraclavicular lymph nodes. I cannot palpate any obvious submandibular lymph nodes. Thyroid  is nonpalpable. Lungs are clear bilaterally. Cardiac exam regular rate and rhythm with no murmurs, rubs or bruits. Abdomen is soft. there is no guarding or rebound tenderness. She has good bowel sounds. There is no fluid wave. There is no palpable liver or spleen tip. Back exam shows no tenderness over the spine, ribs or hips. Extremities shows no clubbing, cyanosis or edema. Neurological exam shows no focal neurological deficits. Skin exam shows no rashes, ecchymoses or petechia.   Lab Results  Component Value Date   WBC 5.3 08/31/2017   HGB 13.5 08/31/2017   HCT 40.4 08/31/2017   MCV 95 08/31/2017   PLT 154 08/31/2017   Lab Results  Component Value Date   FERRITIN 115 04/29/2017   IRON 122 04/29/2017   TIBC 341 04/29/2017   UIBC 219 04/29/2017   IRONPCTSAT 36 04/29/2017   Lab Results  Component Value Date   RBC 4.26 08/31/2017   No results found for: KPAFRELGTCHN, LAMBDASER, KAPLAMBRATIO No results found for: IGGSERUM, IGA, IGMSERUM No results found for: Marda Stalker, SPEI   Chemistry      Component Value Date/Time   NA 141 08/31/2017 1018   NA 139 04/29/2017 1021   K 4.0 08/31/2017 1018   K 4.1 04/29/2017 1021   CL 100 08/31/2017 1018   CO2 30 08/31/2017 1018   CO2 25 04/29/2017 1021   BUN 12 08/31/2017 1018   BUN 18.8 04/29/2017 1021   CREATININE 1.0 08/31/2017 1018   CREATININE 0.8 04/29/2017 1021      Component Value Date/Time   CALCIUM 9.2 08/31/2017 1018   CALCIUM 10.1 04/29/2017 1021   ALKPHOS 75 08/31/2017 1018   ALKPHOS 66 04/29/2017 1021   AST 56 (H) 08/31/2017 1018   AST 58 (H) 04/29/2017 1021   ALT 58 (H) 08/31/2017 1018   ALT 66 (H) 04/29/2017 1021   BILITOT 0.70 08/31/2017 1018   BILITOT 0.46 04/29/2017 1021     Impression and Plan: [name] is a very pleasant 71 yo caucasian female with alpha-1 anti-trypsin deficiency as well as the heterozygote inheritance of hemochromatosis.   we will  have to see what her iron studies show.  I will like to see her back in 2 months. I will like to get her back for the holidays so that we can make sure her blood is doing okay.  I am happy that her liver tests look a little bit better. Veronica Macho, MD 9/25/20182:10 PM

## 2017-09-01 ENCOUNTER — Telehealth: Payer: Self-pay | Admitting: *Deleted

## 2017-09-01 NOTE — Telephone Encounter (Addendum)
Patient is aware of results  ----- Message from [name], MD sent at 09/01/2017  6:25 AM EDT ----- Call - the iron level is great!!!  NO phlebotomy!!  Veronica Meza

## 2017-11-02 ENCOUNTER — Telehealth: Payer: Self-pay | Admitting: *Deleted

## 2017-11-02 ENCOUNTER — Other Ambulatory Visit (HOSPITAL_BASED_OUTPATIENT_CLINIC_OR_DEPARTMENT_OTHER): Payer: Medicare Other

## 2017-11-02 ENCOUNTER — Other Ambulatory Visit: Payer: Self-pay

## 2017-11-02 ENCOUNTER — Encounter: Payer: Self-pay | Admitting: Hematology & Oncology

## 2017-11-02 ENCOUNTER — Ambulatory Visit: Payer: Medicare Other | Admitting: Hematology & Oncology

## 2017-11-02 DIAGNOSIS — E8801 Alpha-1-antitrypsin deficiency: Secondary | ICD-10-CM | POA: Diagnosis not present

## 2017-11-02 DIAGNOSIS — E032 Hypothyroidism due to medicaments and other exogenous substances: Secondary | ICD-10-CM

## 2017-11-02 LAB — CBC WITH DIFFERENTIAL (CANCER CENTER ONLY)
BASO#: 0 10*3/uL (ref 0.0–0.2)
BASO%: 0.4 % (ref 0.0–2.0)
EOS ABS: 0.1 10*3/uL (ref 0.0–0.5)
EOS%: 2.3 % (ref 0.0–7.0)
HCT: 40.1 % (ref 34.8–46.6)
HGB: 13.6 g/dL (ref 11.6–15.9)
LYMPH#: 2.1 10*3/uL (ref 0.9–3.3)
LYMPH%: 37.7 % (ref 14.0–48.0)
MCH: 32.4 pg (ref 26.0–34.0)
MCHC: 33.9 g/dL (ref 32.0–36.0)
MCV: 96 fL (ref 81–101)
MONO#: 0.5 10*3/uL (ref 0.1–0.9)
MONO%: 8.2 % (ref 0.0–13.0)
NEUT#: 2.9 10*3/uL (ref 1.5–6.5)
NEUT%: 51.4 % (ref 39.6–80.0)
PLATELETS: 161 10*3/uL (ref 145–400)
RBC: 4.2 10*6/uL (ref 3.70–5.32)
RDW: 14.2 % (ref 11.1–15.7)
WBC: 5.6 10*3/uL (ref 3.9–10.0)

## 2017-11-02 LAB — CMP (CANCER CENTER ONLY)
ALT(SGPT): 46 U/L (ref 10–47)
AST: 50 U/L — AB (ref 11–38)
Albumin: 3.7 g/dL (ref 3.3–5.5)
Alkaline Phosphatase: 78 U/L (ref 26–84)
BUN: 9 mg/dL (ref 7–22)
CHLORIDE: 106 meq/L (ref 98–108)
CO2: 30 mEq/L (ref 18–33)
CREATININE: 0.7 mg/dL (ref 0.6–1.2)
Calcium: 9.6 mg/dL (ref 8.0–10.3)
GLUCOSE: 83 mg/dL (ref 73–118)
Potassium: 3.9 mEq/L (ref 3.3–4.7)
SODIUM: 148 meq/L — AB (ref 128–145)
Total Bilirubin: 0.6 mg/dl (ref 0.20–1.60)
Total Protein: 7.4 g/dL (ref 6.4–8.1)

## 2017-11-02 LAB — IRON AND TIBC
%SAT: 29 % (ref 21–57)
IRON: 101 ug/dL (ref 41–142)
TIBC: 353 ug/dL (ref 236–444)
UIBC: 252 ug/dL (ref 120–384)

## 2017-11-02 LAB — FERRITIN: Ferritin: 51 ng/ml (ref 9–269)

## 2017-11-02 NOTE — Progress Notes (Signed)
Hematology and Oncology Follow Up Visit  Veronica Meza [id number] 1946/10/09 71 y.o. 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 is here today for follow-up. She has she's feeling pretty well.  She had a very nice Thanksgiving.  She and her family were up in [place].  When we last saw her in September, she then went to [place] for a wedding.  She had a good time up in [place].  Apparently, she lost a family member.  She thought that it was from some type of "memory issue."  She is worried about her own memory.  She thinks that her memory is not as good.  She is worried because of the family members who have passed on.  She thought that several of them had issues with their memory before they died.  I told her that we could check her ammonia level.  She really wanted to have this done.  I do not think this would be a problem.  I would think that her ammonia level should be okay.  We are keeping her iron levels in check.  Her ferritin was 68 with an iron saturation more 34% back in September.    She has not had any lung issues.  She does not have any increased shortness of breath.  I encouraged her to exercise.  I think this will help her.  She has gained a little bit more weight which she is happy about.   Overall, her performance status is ECOG 1.  Medications:  Allergies as of 11/02/2017      Reactions   Codeine Swelling   Sulfa Antibiotics Itching, Nausea And Vomiting   Other reaction(s): Vomiting (intolerance) Other reaction(s): Vomiting (intolerance)   Alendronate    Other reaction(s): GI Upset (intolerance)   Erythromycin Base Nausea Only   Hydrocodone-acetaminophen Swelling      Medication List        Accurate as of 11/02/17 10:49 AM. Always use your most recent med list.            amLODipine 5 MG tablet Commonly known as:  NORVASC 5 mg daily.   atorvastatin 40 MG tablet Commonly known as:  LIPITOR Take 40 mg by mouth daily.   busPIRone 10 MG tablet Commonly known as:  BUSPAR Take 10 mg by mouth 2 (two) times daily.   cromolyn 4 % ophthalmic solution Commonly known as:  OPTICROM PRN allergies.   DOCOSAHEXAENOIC ACID PO Take by mouth daily.   levothyroxine 100 MCG tablet Commonly known as:  SYNTHROID, LEVOTHROID 100 mcg daily.   lisinopril 20 MG tablet Commonly known as:  PRINIVIL,ZESTRIL Take 20 mg by mouth.   loratadine 10 MG tablet Commonly known as:  CLARITIN Take 10 mg by mouth.   mupirocin ointment 2 % Commonly known as:  BACTROBAN Place 1 application into the nose.   TURMERIC PO Take by mouth.       Allergies:  Allergies  Allergen Reactions  . Codeine Swelling  . Sulfa Antibiotics Itching and Nausea And Vomiting    Other reaction(s): Vomiting (intolerance) Other reaction(s): Vomiting (intolerance)  . Alendronate     Other reaction(s): GI Upset (intolerance)  . Erythromycin Base Nausea Only  . Hydrocodone-Acetaminophen Swelling    Past Medical History, Surgical history, Social history, and Family History were reviewed and updated.  Review of Systems: As  stated in the interim history  Physical Exam:  weight is 115 lb (52.2 kg). Her oral temperature is 98.4 F (36.9 C). Her blood pressure is 142/49 (abnormal) and her pulse is 52 (abnormal). Her respiration is 16 and oxygen saturation is 99%.   Wt Readings from Last 3 Encounters:  11/02/17 115 lb (52.2 kg)  08/31/17 111 lb (50.3 kg)  04/29/17 108 lb (49 kg)    Petite white female in no obvious distress.  Head and neck exam shows no ocular or oral lesions.  She has no scleral icterus.  She has no adenopathy in the neck.  Her lungs sound clear.  I do not hear any wheezes.  Cardiac exam regular rate and rhythm with no murmurs, rubs or bruits.  Abdomen is soft.  She has good  bowel sounds.  There is no fluid wave.  There is no palpable liver or spleen tip.  Back exam shows no tenderness over the spine, ribs or hips.  She has no obvious kyphosis.  Extremities shows no clubbing, cyanosis or edema.  Skin exam shows no rashes, ecchymoses or petechia.  Neurological exam shows no obvious neurological deficits.  Lab Results  Component Value Date   WBC 5.6 11/02/2017   HGB 13.6 11/02/2017   HCT 40.1 11/02/2017   MCV 96 11/02/2017   PLT 161 11/02/2017   Lab Results  Component Value Date   FERRITIN 68 08/31/2017   IRON 116 08/31/2017   TIBC 340 08/31/2017   UIBC 224 08/31/2017   IRONPCTSAT 34 08/31/2017   Lab Results  Component Value Date   RBC 4.20 11/02/2017   No results found for: KPAFRELGTCHN, LAMBDASER, KAPLAMBRATIO No results found for: IGGSERUM, IGA, IGMSERUM No results found for: Georgann Housekeeper, MSPIKE, SPEI   Chemistry      Component Value Date/Time   NA 148 (H) 11/02/2017 1007   NA 139 04/29/2017 1021   K 3.9 11/02/2017 1007   K 4.1 04/29/2017 1021   CL 106 11/02/2017 1007   CO2 30 11/02/2017 1007   CO2 25 04/29/2017 1021   BUN 9 11/02/2017 1007   BUN 18.8 04/29/2017 1021   CREATININE 0.7 11/02/2017 1007   CREATININE 0.8 04/29/2017 1021      Component Value Date/Time   CALCIUM 9.6 11/02/2017 1007   CALCIUM 10.1 04/29/2017 1021   ALKPHOS 78 11/02/2017 1007   ALKPHOS 66 04/29/2017 1021   AST 50 (H) 11/02/2017 1007   AST 58 (H) 04/29/2017 1021   ALT 46 11/02/2017 1007   ALT 66 (H) 04/29/2017 1021   BILITOT 0.60 11/02/2017 1007   BILITOT 0.46 04/29/2017 1021     Impression and Plan: [name] is a very pleasant 71 yo caucasian female with alpha-1 anti-trypsin deficiency as well as the heterozygote inheritance of hemochromatosis.   We will go ahead and see what her ammonia level is.  I know her liver tests have been slightly elevated although they are improving.  I will plan to get her back  in another 3 months.  I feel confident that we can get her through the holidays and winter time and then see her back when the weather is better   . [name], MD 11/27/201810:49 AM

## 2017-11-02 NOTE — Telephone Encounter (Addendum)
Patient is aware of results  ----- Message from [name], MD sent at 11/02/2017  2:38 PM EST ----- Call - iron level is great!!  No need for a phlebotomy!!!  [name]

## 2017-11-03 ENCOUNTER — Telehealth: Payer: Self-pay | Admitting: *Deleted

## 2017-11-03 LAB — AMMONIA: Ammonia, Plasma: 53 ug/dL (ref 19–87)

## 2017-11-03 NOTE — Telephone Encounter (Addendum)
Patient is aware of results  ----- Message from Josph MachoPeter R Ennever, MD sent at 11/03/2017 12:44 PM EST ----- Call - the ammonia level is looking ok!!  Cindee LamePete

## 2017-12-06 IMAGING — US US ABDOMEN COMPLETE
1 series · 14 of 25 positions shown · non-contrast
Comparison: CT 08/04/2016.

CLINICAL DATA: Elevated LFTs.

EXAM:
ABDOMEN ULTRASOUND COMPLETE

[Series 1: us abdomen complete · 0.15mm/px · 14 of 83 slices shown]
[im 1/83]
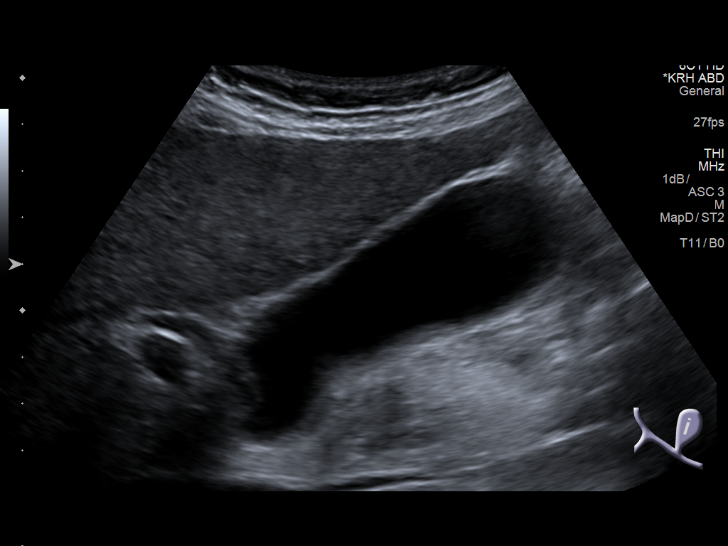
[im 7/83]
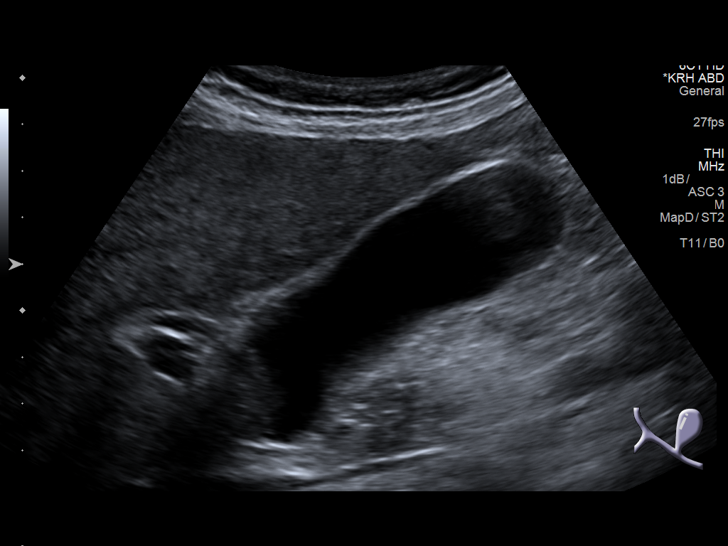
[im 14/83]
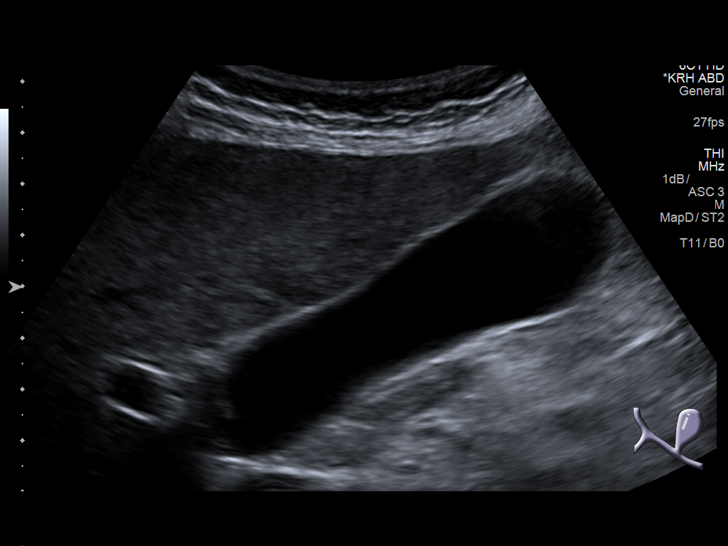
[im 21/83]
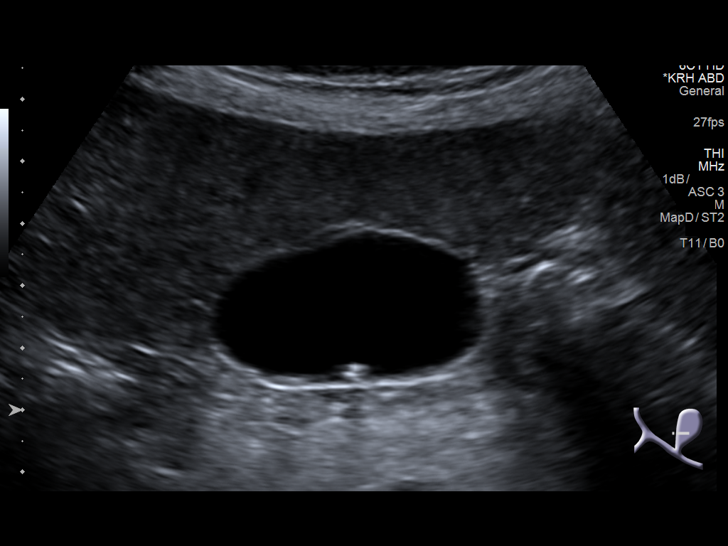
[im 28/83]
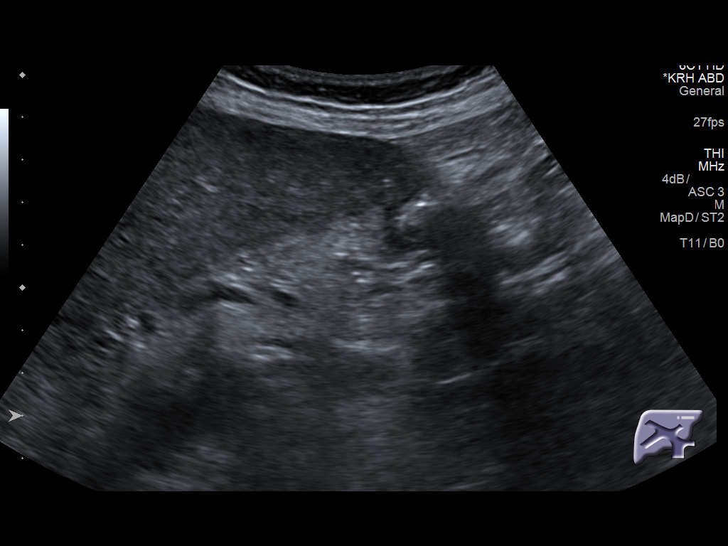
[im 31/83]
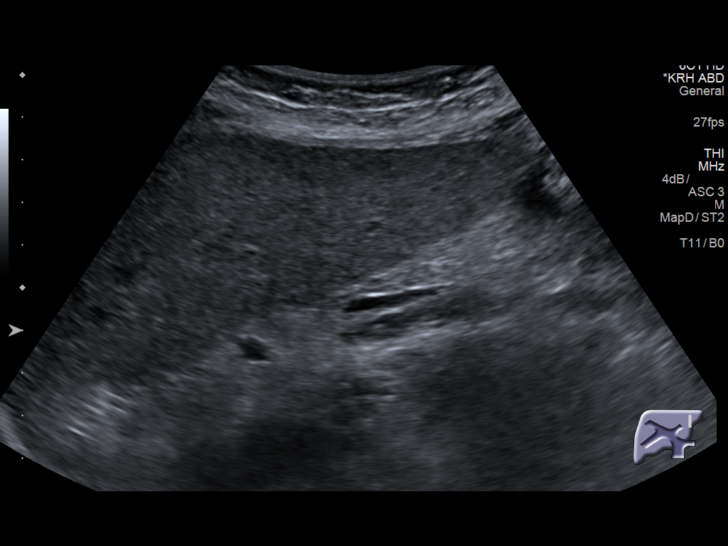
[im 38/83]
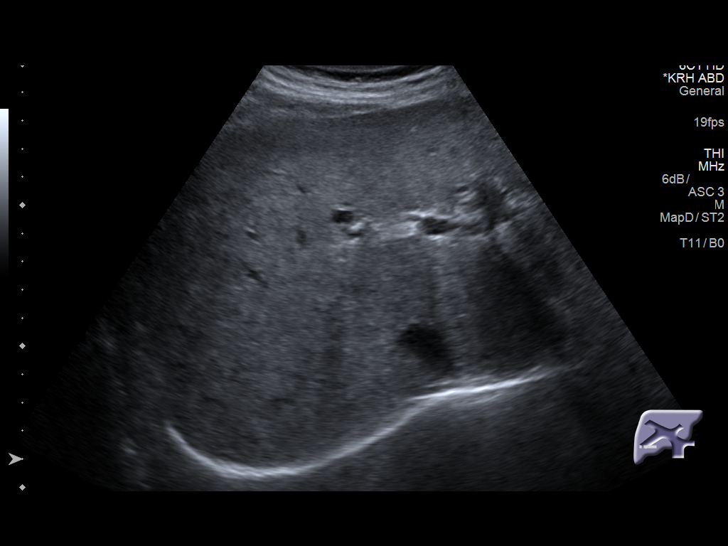
[im 45/83]
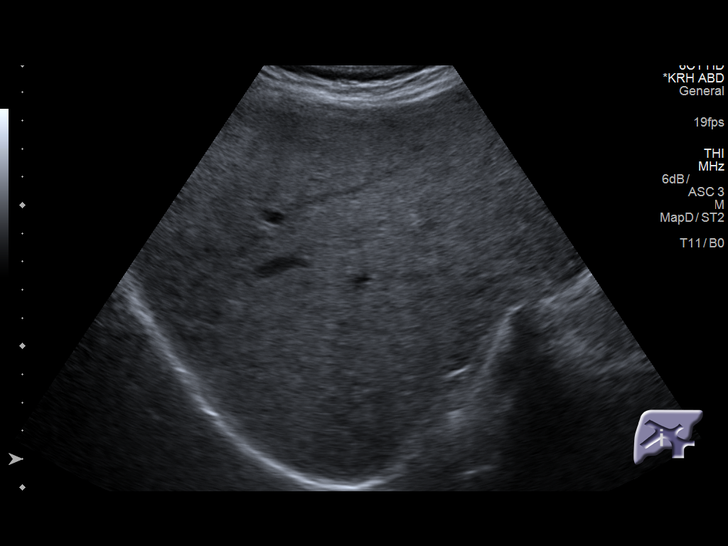
[im 52/83]
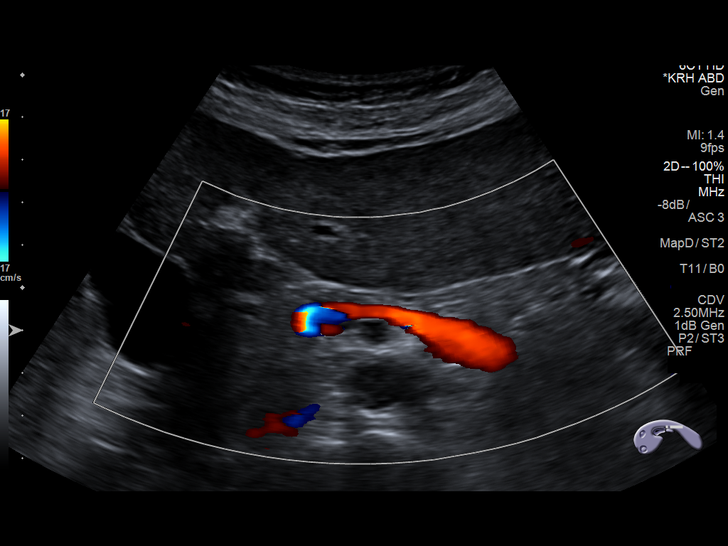
[im 55/83]
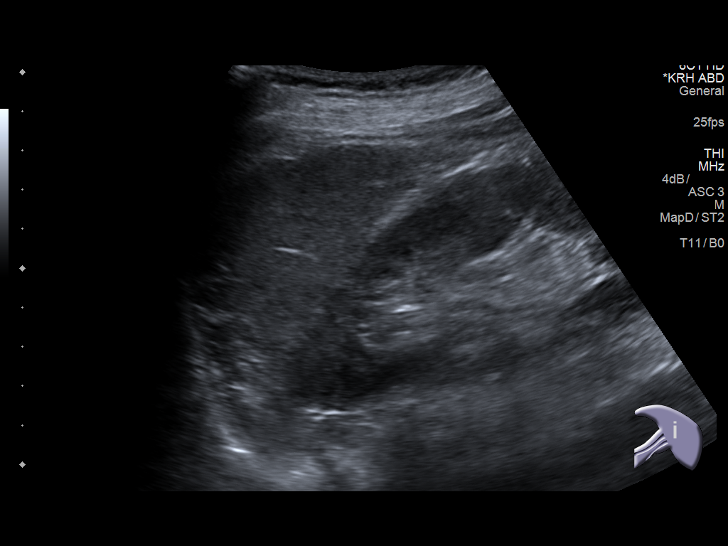
[im 62/83]
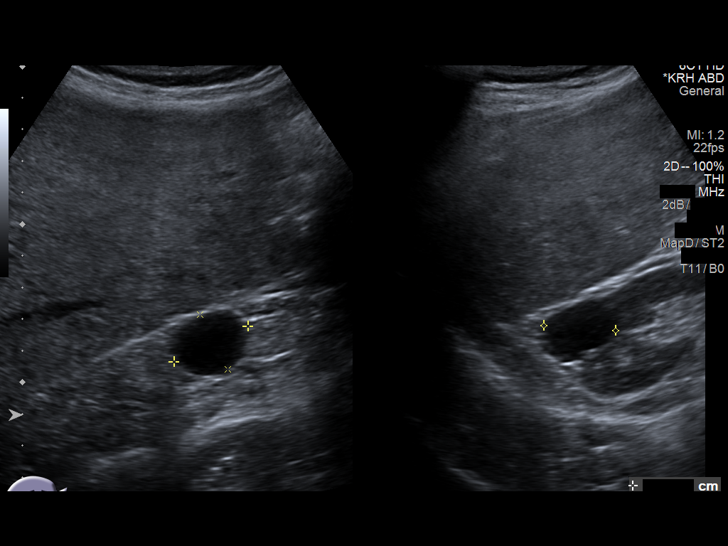
[im 69/83]
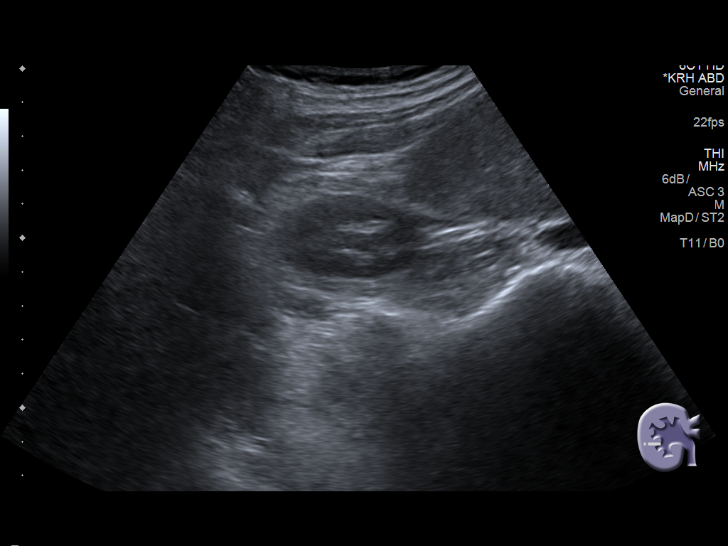
[im 76/83]
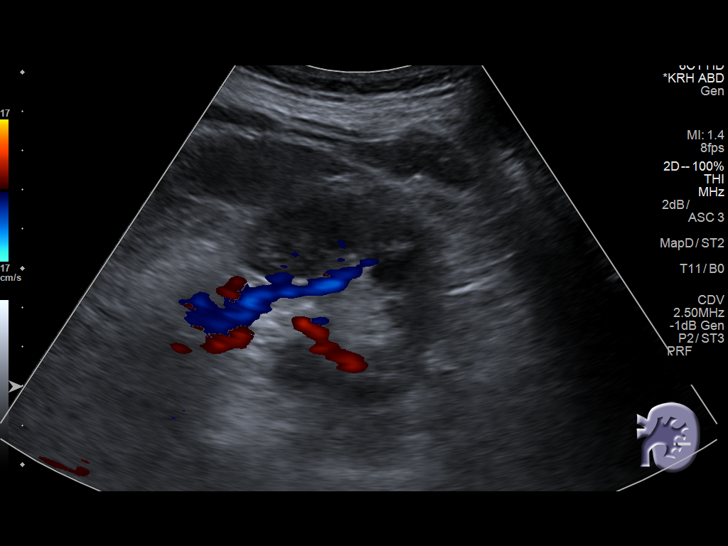
[im 83/83]
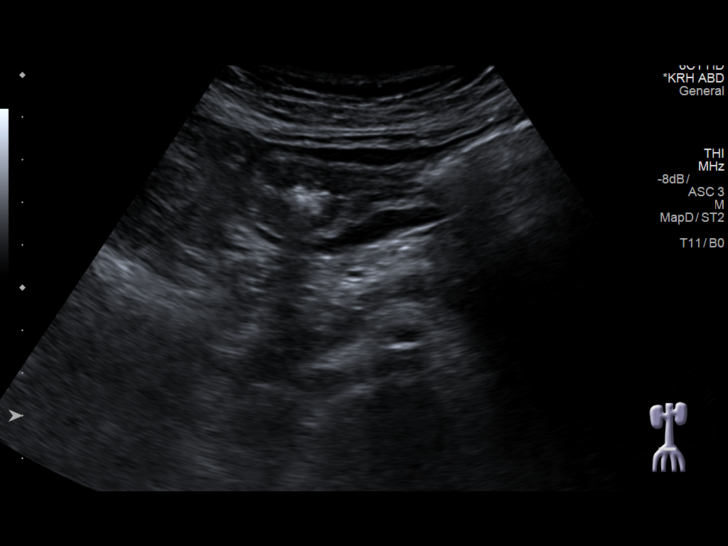

[14 of 25 positions shown; findings below may reference images not displayed]

FINDINGS: Gallbladder: Gallstones are noted. The largest measures 3.6 mm. Mild
sludge noted. Gallbladder wall thickness normal. Negative Murphy
sign.

Common bile duct: Diameter: 5.4 mm

Liver: Liver is heterogeneous consistent fatty infiltration and/or
hepatocellular disease. No focal hepatic abnormality identified.

IVC: No abnormality visualized.

Pancreas: Visualized portion unremarkable.

Spleen: Size and appearance within normal limits.

Right Kidney: Length: 11.7 cm. Echogenicity within normal limits. No
hydronephrosis visualized. 2.6 cm simple cyst.

Left Kidney: Length: 10.8 cm. Echogenicity within normal limits. No
mass or hydronephrosis visualized.

Abdominal aorta: No aneurysm visualized.

Other findings: None.
IMPRESSION: 1. The liver is heterogeneous consistent fatty infiltration and/or
hepatocellular disease. No focal hepatic abnormality identified.

2. Tiny gallstones and mild sludge noted. No evidence of
cholecystitis or biliary distention.

## 2018-02-01 ENCOUNTER — Telehealth: Payer: Self-pay | Admitting: *Deleted

## 2018-02-01 ENCOUNTER — Other Ambulatory Visit: Payer: Self-pay

## 2018-02-01 ENCOUNTER — Inpatient Hospital Stay: Payer: Medicare Other

## 2018-02-01 ENCOUNTER — Encounter: Payer: Self-pay | Admitting: Hematology & Oncology

## 2018-02-01 ENCOUNTER — Inpatient Hospital Stay: Payer: Medicare Other | Attending: Hematology & Oncology | Admitting: Hematology & Oncology

## 2018-02-01 DIAGNOSIS — E8801 Alpha-1-antitrypsin deficiency: Secondary | ICD-10-CM

## 2018-02-01 DIAGNOSIS — Z79899 Other long term (current) drug therapy: Secondary | ICD-10-CM | POA: Insufficient documentation

## 2018-02-01 DIAGNOSIS — D508 Other iron deficiency anemias: Secondary | ICD-10-CM | POA: Diagnosis not present

## 2018-02-01 DIAGNOSIS — E032 Hypothyroidism due to medicaments and other exogenous substances: Secondary | ICD-10-CM

## 2018-02-01 DIAGNOSIS — D510 Vitamin B12 deficiency anemia due to intrinsic factor deficiency: Secondary | ICD-10-CM | POA: Diagnosis not present

## 2018-02-01 LAB — CBC WITH DIFFERENTIAL (CANCER CENTER ONLY)
BASOS ABS: 0.1 10*3/uL (ref 0.0–0.1)
BASOS PCT: 1 %
Eosinophils Absolute: 0.1 10*3/uL (ref 0.0–0.5)
Eosinophils Relative: 2 %
HCT: 42.4 % (ref 34.8–46.6)
Hemoglobin: 14.3 g/dL (ref 11.6–15.9)
LYMPHS PCT: 38 %
Lymphs Abs: 2.1 10*3/uL (ref 0.9–3.3)
MCH: 32.4 pg (ref 26.0–34.0)
MCHC: 33.7 g/dL (ref 32.0–36.0)
MCV: 95.9 fL (ref 81.0–101.0)
MONO ABS: 0.5 10*3/uL (ref 0.1–0.9)
Monocytes Relative: 9 %
NEUTROS ABS: 2.8 10*3/uL (ref 1.5–6.5)
Neutrophils Relative %: 50 %
Platelet Count: 168 10*3/uL (ref 145–400)
RBC: 4.42 MIL/uL (ref 3.70–5.32)
RDW: 13.1 % (ref 11.1–15.7)
WBC: 5.6 10*3/uL (ref 3.9–10.0)

## 2018-02-01 LAB — CMP (CANCER CENTER ONLY)
ALT: 46 U/L (ref 0–55)
ANION GAP: 9 (ref 3–11)
AST: 50 U/L — ABNORMAL HIGH (ref 5–34)
Albumin: 3.9 g/dL (ref 3.5–5.0)
Alkaline Phosphatase: 74 U/L (ref 40–150)
BILIRUBIN TOTAL: 0.5 mg/dL (ref 0.2–1.2)
BUN: 13 mg/dL (ref 7–26)
CALCIUM: 9.7 mg/dL (ref 8.4–10.4)
CO2: 28 mmol/L (ref 22–29)
Chloride: 105 mmol/L (ref 98–109)
Creatinine: 0.77 mg/dL (ref 0.60–1.10)
Glucose, Bld: 100 mg/dL (ref 70–140)
Potassium: 4.5 mmol/L (ref 3.5–5.1)
Sodium: 142 mmol/L (ref 136–145)
TOTAL PROTEIN: 7.4 g/dL (ref 6.4–8.3)

## 2018-02-01 LAB — IRON AND TIBC
IRON: 127 ug/dL (ref 41–142)
SATURATION RATIOS: 35 % (ref 21–57)
TIBC: 359 ug/dL (ref 236–444)
UIBC: 231 ug/dL

## 2018-02-01 LAB — AMMONIA: AMMONIA: 19 umol/L (ref 9–35)

## 2018-02-01 LAB — FERRITIN: Ferritin: 63 ng/mL (ref 9–269)

## 2018-02-01 LAB — TSH: TSH: 0.76 u[IU]/mL (ref 0.308–3.960)

## 2018-02-01 MED ORDER — AMLODIPINE BESYLATE 10 MG PO TABS: 10.0000 mg | ORAL_TABLET | Freq: Every day | ORAL | 6 refills | Status: DC

## 2018-02-01 NOTE — Telephone Encounter (Addendum)
Patient is aware of results   Ennever, Rose Phi, MD  P Onc Nurse Hp        Call - iron level is ok!! No need for phlebotomy!! [name]      ----- Message from [name], MD sent at 02/01/2018 11:37 AM EST ----- Call - ammonia level is 19!!  This is great!!  [name]

## 2018-02-01 NOTE — Progress Notes (Signed)
Hematology and Oncology Follow Up Visit  Veronica Meza [id number] 1946-03-04 72 y.o. 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 She has she's feeling pretty well.  She got through the holidays without any difficulty.  She feels okay.  Her blood pressure is on the high side.  I will increase her amlodipine to 10 mg a day.  She is complaining of some pain on the bottom of her feet.  This might be neuropathy.  I am not sure why she would have this.  Her family doctor can certainly work this up if necessary.  Her last iron studies back in November showed a ferritin of 51 with iron saturation of 29%.  She has had no problems with the flu.  She is had no cough or shortness of breath.  She is had no change in bowel or bladder habits.  Her last ammonia level back in November was 53.  Overall, her performance status is ECOG 1. Overall, her performance status is ECOG 1.  Medications:  Allergies as of 02/01/2018      Reactions   Codeine Swelling   Sulfa Antibiotics Itching, Nausea And Vomiting   Other reaction(s): Vomiting (intolerance) Other reaction(s): Vomiting (intolerance)   Alendronate    Other reaction(s): GI Upset (intolerance)   Erythromycin Base Nausea Only   Hydrocodone-acetaminophen Swelling      Medication List        Accurate as of 02/01/18 10:07 AM. Always use your most recent med list.          amLODipine 5 MG tablet Commonly known as:  NORVASC 5 mg daily.   atorvastatin 40 MG tablet Commonly known as:  LIPITOR Take 40 mg by mouth daily.   busPIRone 10 MG tablet Commonly known as:  BUSPAR Take 10 mg by mouth 2 (two) times daily.   cromolyn 4 % ophthalmic solution Commonly known as:  OPTICROM PRN allergies.   DOCOSAHEXAENOIC ACID PO Take by  mouth daily.   levothyroxine 100 MCG tablet Commonly known as:  SYNTHROID, LEVOTHROID 100 mcg daily.   lisinopril 20 MG tablet Commonly known as:  PRINIVIL,ZESTRIL Take 20 mg by mouth.   loratadine 10 MG tablet Commonly known as:  CLARITIN Take 10 mg by mouth.   mupirocin ointment 2 % Commonly known as:  BACTROBAN Place 1 application into the nose.   TURMERIC PO Take by mouth.       Allergies:  Allergies  Allergen Reactions  . Codeine Swelling  . Sulfa Antibiotics Itching and Nausea And Vomiting    Other reaction(s): Vomiting (intolerance) Other reaction(s): Vomiting (intolerance)  . Alendronate     Other reaction(s): GI Upset (intolerance)  . Erythromycin Base Nausea Only  . Hydrocodone-Acetaminophen Swelling    Past Medical History, Surgical history, Social history, and Family History were reviewed and updated.  Review of Systems: Review of Systems  Constitutional: Negative.   HENT: Negative.   Eyes: Negative.   Respiratory: Negative.   Cardiovascular: Negative.   Gastrointestinal: Negative.   Genitourinary: Negative.   Musculoskeletal: Positive for myalgias.  Skin: Negative.   Neurological: Positive for tingling.  Endo/Heme/Allergies: Negative.   Psychiatric/Behavioral: Negative.      Physical Exam:  weight is 116 lb (52.6 kg). Her oral temperature is 97.7 F (36.5 C). Her blood pressure is 177/55 (abnormal) and her  pulse is 57 (abnormal). Her respiration is 17 and oxygen saturation is 98%.   Wt Readings from Last 3 Encounters:  02/01/18 116 lb (52.6 kg)  11/02/17 115 lb (52.2 kg)  08/31/17 111 lb (50.3 kg)    Physical Exam  Constitutional: She is oriented to person, place, and time.  HENT:  Head: Normocephalic and atraumatic.  Mouth/Throat: Oropharynx is clear and moist.  Eyes: EOM are normal. Pupils are equal, round, and reactive to light.  Neck: Normal range of motion.  Cardiovascular: Normal rate, regular rhythm and normal heart sounds.    Pulmonary/Chest: Effort normal and breath sounds normal.  Abdominal: Soft. Bowel sounds are normal.  Musculoskeletal: Normal range of motion. She exhibits no edema, tenderness or deformity.  Lymphadenopathy:    She has no cervical adenopathy.  Neurological: She is alert and oriented to person, place, and time.  Skin: Skin is warm and dry. No rash noted. No erythema.  Psychiatric: She has a normal mood and affect. Her behavior is normal. Judgment and thought content normal.  Vitals reviewed.   Lab Results  Component Value Date   WBC 5.6 02/01/2018   HGB 13.6 11/02/2017   HCT 42.4 02/01/2018   MCV 95.9 02/01/2018   PLT 168 02/01/2018   Lab Results  Component Value Date   FERRITIN 51 11/02/2017   IRON 101 11/02/2017   TIBC 353 11/02/2017   UIBC 252 11/02/2017   IRONPCTSAT 29 11/02/2017   Lab Results  Component Value Date   RBC 4.42 02/01/2018   No results found for: KPAFRELGTCHN, LAMBDASER, KAPLAMBRATIO No results found for: IGGSERUM, IGA, IGMSERUM No results found for: Georgann Housekeeper, MSPIKE, SPEI   Chemistry      Component Value Date/Time   NA 148 (H) 11/02/2017 1007   NA 139 04/29/2017 1021   K 3.9 11/02/2017 1007   K 4.1 04/29/2017 1021   CL 106 11/02/2017 1007   CO2 30 11/02/2017 1007   CO2 25 04/29/2017 1021   BUN 9 11/02/2017 1007   BUN 18.8 04/29/2017 1021   CREATININE 0.7 11/02/2017 1007   CREATININE 0.8 04/29/2017 1021      Component Value Date/Time   CALCIUM 9.6 11/02/2017 1007   CALCIUM 10.1 04/29/2017 1021   ALKPHOS 78 11/02/2017 1007   ALKPHOS 66 04/29/2017 1021   AST 50 (H) 11/02/2017 1007   AST 58 (H) 04/29/2017 1021   ALT 46 11/02/2017 1007   ALT 66 (H) 04/29/2017 1021   BILITOT 0.60 11/02/2017 1007   BILITOT 0.46 04/29/2017 1021     Impression and Plan: Veronica Meza is a very pleasant 72 yo caucasian female with alpha-1 anti-trypsin deficiency as well as the heterozygote inheritance of  hemochromatosis.   We will see what her iron levels show.  Hopefully, they will still be all right that we will not have to phlebotomize her.  Hopefully, the increase in amlodipine will help with her blood pressure.  She will see her family doctor within a month.  We will plan to get her back in another 2 months for follow-up.  I just want to have a little bit more of a close follow-up with her given her blood pressure.  Marland Kitchen [name], MD 2/26/201910:07 AM

## 2018-04-05 ENCOUNTER — Inpatient Hospital Stay: Payer: Medicare HMO | Attending: Hematology & Oncology | Admitting: Hematology & Oncology

## 2018-04-05 ENCOUNTER — Inpatient Hospital Stay: Payer: Medicare HMO

## 2018-04-05 DIAGNOSIS — Z79899 Other long term (current) drug therapy: Secondary | ICD-10-CM | POA: Insufficient documentation

## 2018-04-05 DIAGNOSIS — D510 Vitamin B12 deficiency anemia due to intrinsic factor deficiency: Secondary | ICD-10-CM

## 2018-04-05 DIAGNOSIS — D508 Other iron deficiency anemias: Secondary | ICD-10-CM

## 2018-04-05 DIAGNOSIS — E8801 Alpha-1-antitrypsin deficiency: Secondary | ICD-10-CM

## 2018-04-05 DIAGNOSIS — R413 Other amnesia: Secondary | ICD-10-CM | POA: Insufficient documentation

## 2018-04-05 LAB — CMP (CANCER CENTER ONLY)
ALT: 52 U/L (ref 0–55)
ANION GAP: 9 (ref 3–11)
AST: 61 U/L — ABNORMAL HIGH (ref 5–34)
Albumin: 4 g/dL (ref 3.5–5.0)
Alkaline Phosphatase: 88 U/L (ref 40–150)
BUN: 13 mg/dL (ref 7–26)
CALCIUM: 10.2 mg/dL (ref 8.4–10.4)
CHLORIDE: 104 mmol/L (ref 98–109)
CO2: 28 mmol/L (ref 22–29)
Creatinine: 0.78 mg/dL (ref 0.60–1.10)
Glucose, Bld: 98 mg/dL (ref 70–140)
Potassium: 4.2 mmol/L (ref 3.5–5.1)
SODIUM: 141 mmol/L (ref 136–145)
Total Bilirubin: 0.5 mg/dL (ref 0.2–1.2)
Total Protein: 7.3 g/dL (ref 6.4–8.3)

## 2018-04-05 LAB — IRON AND TIBC
IRON: 126 ug/dL (ref 41–142)
Saturation Ratios: 39 % (ref 21–57)
TIBC: 321 ug/dL (ref 236–444)
UIBC: 195 ug/dL

## 2018-04-05 LAB — CBC WITH DIFFERENTIAL (CANCER CENTER ONLY)
Basophils Absolute: 0 10*3/uL (ref 0.0–0.1)
Basophils Relative: 1 %
EOS ABS: 0.2 10*3/uL (ref 0.0–0.5)
Eosinophils Relative: 3 %
HCT: 41.7 % (ref 34.8–46.6)
Hemoglobin: 14.2 g/dL (ref 11.6–15.9)
LYMPHS ABS: 2.2 10*3/uL (ref 0.9–3.3)
Lymphocytes Relative: 34 %
MCH: 32.2 pg (ref 26.0–34.0)
MCHC: 34.1 g/dL (ref 32.0–36.0)
MCV: 94.6 fL (ref 81.0–101.0)
MONO ABS: 0.5 10*3/uL (ref 0.1–0.9)
MONOS PCT: 8 %
Neutro Abs: 3.5 10*3/uL (ref 1.5–6.5)
Neutrophils Relative %: 54 %
PLATELETS: 172 10*3/uL (ref 145–400)
RBC: 4.41 MIL/uL (ref 3.70–5.32)
RDW: 13.2 % (ref 11.1–15.7)
WBC Count: 6.4 10*3/uL (ref 3.9–10.0)

## 2018-04-05 LAB — AMMONIA: AMMONIA: 21 umol/L (ref 9–35)

## 2018-04-05 LAB — FERRITIN: FERRITIN: 91 ng/mL (ref 9–269)

## 2018-04-05 NOTE — Addendum Note (Signed)
Addended by: Arlan Organ R on: 04/05/2018 12:35 PM   Modules accepted: Orders

## 2018-04-05 NOTE — Progress Notes (Signed)
Hematology and Oncology Follow Up Visit  Veronica Meza [id number] Dec 01, 1946 72 y.o. 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 is here today for follow-up.  Her problem that I can tell right now is memory.  It does not look like she has good short-term memory.  I am not sure as to why this would be the case.  I suspect that she probably is going to need some type of neuropsychological testing to see what might be going on.  I believe that her hemochromatosis is very well controlled.  Back in February, her ferritin was 63 with an iron saturation of 35%.  She is had no problems with fever.  She is had no infections.  She has had no problems with breathing.  She is had no wheezes.  Her last ammonia level back in back in February was 19.    Overall, her performance status is ECOG 1.  Medications:  Allergies as of 04/05/2018      Reactions   Codeine Swelling   Sulfa Antibiotics Itching, Nausea And Vomiting   Other reaction(s): Vomiting (intolerance) Other reaction(s): Vomiting (intolerance)   Alendronate    Other reaction(s): GI Upset (intolerance)   Erythromycin Base Nausea Only   Hydrocodone-acetaminophen Swelling      Medication List        Accurate as of 04/05/18 11:37 AM. Always use your most recent med list.          amLODipine 10 MG tablet Commonly known as:  NORVASC Take 1 tablet (10 mg total) by mouth daily.   atorvastatin 40 MG tablet Commonly known as:  LIPITOR Take 40 mg by mouth daily.   busPIRone 10 MG tablet Commonly known as:  BUSPAR Take 10 mg by mouth 2 (two) times daily.   cromolyn 4 % ophthalmic solution Commonly known as:  OPTICROM PRN allergies.   DOCOSAHEXAENOIC ACID PO Take by mouth daily.   levothyroxine 100 MCG tablet Commonly known as:  SYNTHROID,  LEVOTHROID 100 mcg daily.   lisinopril 20 MG tablet Commonly known as:  PRINIVIL,ZESTRIL Take 20 mg by mouth.   loratadine 10 MG tablet Commonly known as:  CLARITIN Take 10 mg by mouth.   mupirocin ointment 2 % Commonly known as:  BACTROBAN Place 1 application into the nose.   TURMERIC PO Take by mouth.       Allergies:  Allergies  Allergen Reactions  . Codeine Swelling  . Sulfa Antibiotics Itching and Nausea And Vomiting    Other reaction(s): Vomiting (intolerance) Other reaction(s): Vomiting (intolerance)  . Alendronate     Other reaction(s): GI Upset (intolerance)  . Erythromycin Base Nausea Only  . Hydrocodone-Acetaminophen Swelling    Past Medical History, Surgical history, Social history, and Family History were reviewed and updated.  Review of Systems: Review of Systems  Constitutional: Negative.   HENT: Negative.   Eyes: Negative.   Respiratory: Negative.   Cardiovascular: Negative.   Gastrointestinal: Negative.   Genitourinary: Negative.   Musculoskeletal: Positive for myalgias.  Skin: Negative.   Neurological: Positive for tingling.  Endo/Heme/Allergies: Negative.   Psychiatric/Behavioral: Negative.      Physical Exam:  weight is 112 lb 12 oz (51.1 kg). Her oral temperature is 98 F (36.7 C). Her blood pressure is 142/49 (abnormal) and her pulse is 68. Her respiration is 16 and oxygen saturation is 98%.  Wt Readings from Last 3 Encounters:  04/05/18 112 lb 12 oz (51.1 kg)  02/01/18 116 lb (52.6 kg)  11/02/17 115 lb (52.2 kg)    Physical Exam  Constitutional: She is oriented to person, place, and time.  HENT:  Head: Normocephalic and atraumatic.  Mouth/Throat: Oropharynx is clear and moist.  Eyes: Pupils are equal, round, and reactive to light. EOM are normal.  Neck: Normal range of motion.  Cardiovascular: Normal rate, regular rhythm and normal heart sounds.  Pulmonary/Chest: Effort normal and breath sounds normal.  Abdominal: Soft.  Bowel sounds are normal.  Musculoskeletal: Normal range of motion. She exhibits no edema, tenderness or deformity.  Lymphadenopathy:    She has no cervical adenopathy.  Neurological: She is alert and oriented to person, place, and time.  Skin: Skin is warm and dry. No rash noted. No erythema.  Psychiatric: She has a normal mood and affect. Her behavior is normal. Judgment and thought content normal.  Vitals reviewed.   Lab Results  Component Value Date   WBC 6.4 04/05/2018   HGB 14.2 04/05/2018   HCT 41.7 04/05/2018   MCV 94.6 04/05/2018   PLT 172 04/05/2018   Lab Results  Component Value Date   FERRITIN 63 02/01/2018   IRON 127 02/01/2018   TIBC 359 02/01/2018   UIBC 231 02/01/2018   IRONPCTSAT 35 02/01/2018   Lab Results  Component Value Date   RBC 4.41 04/05/2018   No results found for: KPAFRELGTCHN, LAMBDASER, KAPLAMBRATIO No results found for: IGGSERUM, IGA, IGMSERUM No results found for: Marda Stalker, SPEI   Chemistry      Component Value Date/Time   NA 142 02/01/2018 0922   NA 148 (H) 11/02/2017 1007   NA 139 04/29/2017 1021   K 4.5 02/01/2018 0922   K 3.9 11/02/2017 1007   K 4.1 04/29/2017 1021   CL 105 02/01/2018 0922   CL 106 11/02/2017 1007   CO2 28 02/01/2018 0922   CO2 30 11/02/2017 1007   CO2 25 04/29/2017 1021   BUN 13 02/01/2018 0922   BUN 9 11/02/2017 1007   BUN 18.8 04/29/2017 1021   CREATININE 0.77 02/01/2018 0922   CREATININE 0.7 11/02/2017 1007   CREATININE 0.8 04/29/2017 1021      Component Value Date/Time   CALCIUM 9.7 02/01/2018 0922   CALCIUM 9.6 11/02/2017 1007   CALCIUM 10.1 04/29/2017 1021   ALKPHOS 74 02/01/2018 0922   ALKPHOS 78 11/02/2017 1007   ALKPHOS 66 04/29/2017 1021   AST 50 (H) 02/01/2018 0922   AST 58 (H) 04/29/2017 1021   ALT 46 02/01/2018 0922   ALT 46 11/02/2017 1007   ALT 66 (H) 04/29/2017 1021   BILITOT 0.5 02/01/2018 0922   BILITOT 0.46 04/29/2017 1021       Impression and Plan: [name] is a very pleasant 72 yo caucasian female with alpha-1 anti-trypsin deficiency as well as the heterozygote inheritance of hemochromatosis.   We will see what her iron levels show.  Hopefully, they will still be all right that we will not have to phlebotomize her.  I will see about getting some neuropsychologic testing for her.  I really think we can probably get her back in 4 months.  We will try to get her through the summertime.  Marland Kitchen [name], MD 4/30/201911:37 AM

## 2018-04-06 ENCOUNTER — Telehealth: Payer: Self-pay | Admitting: *Deleted

## 2018-04-06 NOTE — Telephone Encounter (Addendum)
Patient is aware of results. Appointment made.   ----- Message from [name], MD sent at 04/06/2018  6:09 AM EDT ----- Call - the iron is creeping up.  Need to do 1 phlebotomy.  Please set this up.  pete

## 2018-04-07 ENCOUNTER — Inpatient Hospital Stay: Payer: Medicare HMO | Attending: Hematology & Oncology

## 2018-04-07 NOTE — Progress Notes (Signed)
Veronica Meza Cardella presents today for phlebotomy per MD orders. Phlebotomy procedure started with 22 g angiocatheter at 1310 and ended at 1325. 510 grams removed. Patient observed for 30 minutes after procedure without any incident. Patient tolerated procedure well. IV needle removed intact.

## 2018-04-07 NOTE — Patient Instructions (Signed)
Therapeutic Phlebotomy Therapeutic phlebotomy is the controlled removal of blood from a person's body for the purpose of treating a medical condition. The procedure is similar to donating blood. Usually, about a pint (470 mL, or 0.47L) of blood is removed. The average adult has 9-12 pints (4.3-5.7 L) of blood. Therapeutic phlebotomy may be used to treat the following medical conditions:  Hemochromatosis. This is a condition in which the blood contains too much iron.  Polycythemia vera. This is a condition in which the blood contains too many red blood cells.  Porphyria cutanea tarda. This is a disease in which an important part of hemoglobin is not made properly. It results in the buildup of abnormal amounts of porphyrins in the body.  Sickle cell disease. This is a condition in which the red blood cells form an abnormal crescent shape rather than a round shape.  Tell a health care provider about:  Any allergies you have.  All medicines you are taking, including vitamins, herbs, eye drops, creams, and over-the-counter medicines.  Any problems you or family members have had with anesthetic medicines.  Any blood disorders you have.  Any surgeries you have had.  Any medical conditions you have. What are the risks? Generally, this is a safe procedure. However, problems may occur, including:  Nausea or light-headedness.  Low blood pressure.  Soreness, bleeding, swelling, or bruising at the needle insertion site.  Infection.  What happens before the procedure?  Follow instructions from your health care provider about eating or drinking restrictions.  Ask your health care provider about changing or stopping your regular medicines. This is especially important if you are taking diabetes medicines or blood thinners.  Wear clothing with sleeves that can be raised above the elbow.  Plan to have someone take you home after the procedure.  You may have a blood sample taken. What  happens during the procedure?  A needle will be inserted into one of your veins.  Tubing and a collection bag will be attached to that needle.  Blood will flow through the needle and tubing into the collection bag.  You may be asked to open and close your hand slowly and continually during the entire collection.  After the specified amount of blood has been removed from your body, the collection bag and tubing will be clamped.  The needle will be removed from your vein.  Pressure will be held on the site of the needle insertion to stop the bleeding.  A bandage (dressing) will be placed over the needle insertion site. The procedure may vary among health care providers and hospitals. What happens after the procedure?  Your recovery will be assessed and monitored.  You can return to your normal activities as directed by your health care provider. This information is not intended to replace advice given to you by your health care provider. Make sure you discuss any questions you have with your health care provider. Document Released: 04/27/2011 Document Revised: 07/25/2016 Document Reviewed: 11/19/2014 Elsevier Interactive Patient Education  2018 [employer] Inc[employer]  

## 2018-05-05 ENCOUNTER — Telehealth: Payer: Self-pay | Admitting: *Deleted

## 2018-05-05 NOTE — Telephone Encounter (Signed)
Patient is being prescribed Prolia by another physician and she wants to make sure Dr Myna Hidalgo is okay with her getting this medication.   Dr Myna Hidalgo is fine with patient receiving Prolia. Patient aware of agreement.

## 2018-08-03 ENCOUNTER — Encounter: Payer: Self-pay | Admitting: Hematology & Oncology

## 2018-08-03 ENCOUNTER — Inpatient Hospital Stay: Payer: Medicare HMO | Attending: Hematology & Oncology | Admitting: Hematology & Oncology

## 2018-08-03 ENCOUNTER — Other Ambulatory Visit: Payer: Self-pay

## 2018-08-03 ENCOUNTER — Inpatient Hospital Stay: Payer: Medicare HMO

## 2018-08-03 DIAGNOSIS — E8801 Alpha-1-antitrypsin deficiency: Secondary | ICD-10-CM | POA: Insufficient documentation

## 2018-08-03 DIAGNOSIS — Z79899 Other long term (current) drug therapy: Secondary | ICD-10-CM | POA: Insufficient documentation

## 2018-08-03 DIAGNOSIS — G300 Alzheimer's disease with early onset: Secondary | ICD-10-CM | POA: Insufficient documentation

## 2018-08-03 LAB — CBC WITH DIFFERENTIAL (CANCER CENTER ONLY)
Basophils Absolute: 0.1 10*3/uL (ref 0.0–0.1)
Basophils Relative: 1 %
Eosinophils Absolute: 0.1 10*3/uL (ref 0.0–0.5)
Eosinophils Relative: 2 %
HEMATOCRIT: 42.8 % (ref 34.8–46.6)
Hemoglobin: 14.3 g/dL (ref 11.6–15.9)
LYMPHS PCT: 34 %
Lymphs Abs: 2 10*3/uL (ref 0.9–3.3)
MCH: 31.5 pg (ref 26.0–34.0)
MCHC: 33.4 g/dL (ref 32.0–36.0)
MCV: 94.3 fL (ref 81.0–101.0)
MONO ABS: 0.6 10*3/uL (ref 0.1–0.9)
MONOS PCT: 10 %
NEUTROS ABS: 3.2 10*3/uL (ref 1.5–6.5)
Neutrophils Relative %: 53 %
Platelet Count: 160 10*3/uL (ref 145–400)
RBC: 4.54 MIL/uL (ref 3.70–5.32)
RDW: 13.4 % (ref 11.1–15.7)
WBC Count: 6 10*3/uL (ref 3.9–10.0)

## 2018-08-03 LAB — CMP (CANCER CENTER ONLY)
ALT: 48 U/L — ABNORMAL HIGH (ref 0–44)
ANION GAP: 7 (ref 5–15)
AST: 50 U/L — ABNORMAL HIGH (ref 15–41)
Albumin: 3.9 g/dL (ref 3.5–5.0)
Alkaline Phosphatase: 60 U/L (ref 38–126)
BILIRUBIN TOTAL: 0.4 mg/dL (ref 0.3–1.2)
BUN: 19 mg/dL (ref 8–23)
CO2: 29 mmol/L (ref 22–32)
Calcium: 9.4 mg/dL (ref 8.9–10.3)
Chloride: 105 mmol/L (ref 98–111)
Creatinine: 0.76 mg/dL (ref 0.44–1.00)
GFR, Est AFR Am: >60 mL/min (ref >60)
Glucose, Bld: 87 mg/dL (ref 70–99)
POTASSIUM: 3.8 mmol/L (ref 3.5–5.1)
Sodium: 141 mmol/L (ref 135–145)
TOTAL PROTEIN: 7.2 g/dL (ref 6.5–8.1)

## 2018-08-03 NOTE — Progress Notes (Signed)
Hematology and Oncology Follow Up Visit  Veronica Meza [id number] May 18, 1946 72 y.o. 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 is here today for follow-up.  It looks like she does have some dementia.  She had neuropsychologic testing.  This unfortunately showed that she had early onset Alzheimer's disease.  I am sure that she will manage this.  She has a lot of good support.  She and her husband are had up to [place] today.  She has a 55-year class reunion for her high school.  She is looking forward to this.  Her last saw her in April, her ferritin was 91 with iron saturation of 39%.   She has had no problems with breathing.  She is had no flareups of any kind of lung disease.  She did have a fracture of her lower lumbar and sacrum.  She is not sure how this happened.  She saw orthopedic surgery for this.  She is currently on Prolia.  Overall, her performance status is ECOG 1.  Medications:  Allergies as of 08/03/2018      Reactions   Codeine Swelling   Sulfa Antibiotics Itching, Nausea And Vomiting   Other reaction(s): Vomiting (intolerance) Other reaction(s): Vomiting (intolerance)   Alendronate    Other reaction(s): GI Upset (intolerance)   Erythromycin Base Nausea Only   Hydrocodone-acetaminophen Swelling      Medication List        Accurate as of 08/03/18 11:04 AM. Always use your most recent med list.          amLODipine 10 MG tablet Commonly known as:  NORVASC Take 1 tablet (10 mg total) by mouth daily.   atorvastatin 40 MG tablet Commonly known as:  LIPITOR Take 40 mg by mouth daily.   busPIRone 10 MG tablet Commonly known as:  BUSPAR Take 10 mg by mouth 2 (two) times daily.   cromolyn 4 % ophthalmic solution Commonly known as:  OPTICROM PRN allergies.   DOCOSAHEXAENOIC  ACID PO Take by mouth daily.   levothyroxine 100 MCG tablet Commonly known as:  SYNTHROID, LEVOTHROID 100 mcg daily.   lisinopril 20 MG tablet Commonly known as:  PRINIVIL,ZESTRIL Take 20 mg by mouth.   loratadine 10 MG tablet Commonly known as:  CLARITIN Take 10 mg by mouth.   mupirocin ointment 2 % Commonly known as:  BACTROBAN Place 1 application into the nose.   TURMERIC PO Take by mouth.       Allergies:  Allergies  Allergen Reactions  . Codeine Swelling  . Sulfa Antibiotics Itching and Nausea And Vomiting    Other reaction(s): Vomiting (intolerance) Other reaction(s): Vomiting (intolerance)  . Alendronate     Other reaction(s): GI Upset (intolerance)  . Erythromycin Base Nausea Only  . Hydrocodone-Acetaminophen Swelling    Past Medical History, Surgical history, Social history, and Family History were reviewed and updated.  Review of Systems: Review of Systems  Constitutional: Negative.   HENT: Negative.   Eyes: Negative.   Respiratory: Negative.   Cardiovascular: Negative.   Gastrointestinal: Negative.   Genitourinary: Negative.   Musculoskeletal: Positive for myalgias.  Skin: Negative.   Neurological: Positive for tingling.  Endo/Heme/Allergies: Negative.   Psychiatric/Behavioral: Negative.      Physical Exam:  weight is 110 lb 1.9 oz (50 kg). Her oral temperature is 98.1 F (36.7 C). Her blood  pressure is 131/48 (abnormal) and her pulse is 56 (abnormal). Her respiration is 20 and oxygen saturation is 98%.   Wt Readings from Last 3 Encounters:  08/03/18 110 lb 1.9 oz (50 kg)  04/05/18 112 lb 12 oz (51.1 kg)  02/01/18 116 lb (52.6 kg)    Physical Exam  Constitutional: She is oriented to person, place, and time.  HENT:  Head: Normocephalic and atraumatic.  Mouth/Throat: Oropharynx is clear and moist.  Eyes: Pupils are equal, round, and reactive to light. EOM are normal.  Neck: Normal range of motion.  Cardiovascular: Normal rate, regular  rhythm and normal heart sounds.  Pulmonary/Chest: Effort normal and breath sounds normal.  Abdominal: Soft. Bowel sounds are normal.  Musculoskeletal: Normal range of motion. She exhibits no edema, tenderness or deformity.  Lymphadenopathy:    She has no cervical adenopathy.  Neurological: She is alert and oriented to person, place, and time.  Skin: Skin is warm and dry. No rash noted. No erythema.  Psychiatric: She has a normal mood and affect. Her behavior is normal. Judgment and thought content normal.  Vitals reviewed.   Lab Results  Component Value Date   WBC 6.0 08/03/2018   HGB 14.3 08/03/2018   HCT 42.8 08/03/2018   MCV 94.3 08/03/2018   PLT 160 08/03/2018   Lab Results  Component Value Date   FERRITIN 91 04/05/2018   IRON 126 04/05/2018   TIBC 321 04/05/2018   UIBC 195 04/05/2018   IRONPCTSAT 39 04/05/2018   Lab Results  Component Value Date   RBC 4.54 08/03/2018   No results found for: KPAFRELGTCHN, LAMBDASER, KAPLAMBRATIO No results found for: IGGSERUM, IGA, IGMSERUM No results found for: Marda Stalker, SPEI   Chemistry      Component Value Date/Time   NA 141 04/05/2018 1024   NA 148 (H) 11/02/2017 1007   NA 139 04/29/2017 1021   K 4.2 04/05/2018 1024   K 3.9 11/02/2017 1007   K 4.1 04/29/2017 1021   CL 104 04/05/2018 1024   CL 106 11/02/2017 1007   CO2 28 04/05/2018 1024   CO2 30 11/02/2017 1007   CO2 25 04/29/2017 1021   BUN 13 04/05/2018 1024   BUN 9 11/02/2017 1007   BUN 18.8 04/29/2017 1021   CREATININE 0.78 04/05/2018 1024   CREATININE 0.7 11/02/2017 1007   CREATININE 0.8 04/29/2017 1021      Component Value Date/Time   CALCIUM 10.2 04/05/2018 1024   CALCIUM 9.6 11/02/2017 1007   CALCIUM 10.1 04/29/2017 1021   ALKPHOS 88 04/05/2018 1024   ALKPHOS 78 11/02/2017 1007   ALKPHOS 66 04/29/2017 1021   AST 61 (H) 04/05/2018 1024   AST 58 (H) 04/29/2017 1021   ALT 52 04/05/2018 1024   ALT  46 11/02/2017 1007   ALT 66 (H) 04/29/2017 1021   BILITOT 0.5 04/05/2018 1024   BILITOT 0.46 04/29/2017 1021     Impression and Plan: Ms. Tatham is a very pleasant 72 yo caucasian female with alpha-1 anti-trypsin deficiency as well as the heterozygote inheritance of hemochromatosis.   We will see what her iron levels show.  Hopefully, they will still be all right that we will not have to phlebotomize her.  I am glad that she got the neuropsychologic testing.  She certainly feels better knowing what the problem is.  We will plan to see her back before the holidays.  I want to make sure that I see her back  before the holidays so that we can make sure her blood is at the optimal levels.  Marland Kitchen [name], MD 8/28/201911:04 AM

## 2018-08-04 ENCOUNTER — Telehealth: Payer: Self-pay | Admitting: *Deleted

## 2018-08-04 LAB — IRON AND TIBC
Iron: 140 ug/dL (ref 41–142)
SATURATION RATIOS: 36 % (ref 21–57)
TIBC: 389 ug/dL (ref 236–444)
UIBC: 249 ug/dL

## 2018-08-04 LAB — FERRITIN: Ferritin: 43 ng/mL (ref 11–307)

## 2018-08-04 NOTE — Telephone Encounter (Addendum)
Patient is aware of results  ----- Message from [name], MD sent at 08/04/2018  9:51 AM EDT ----- Call - iron level is great!!!  No need for phlebotomy!!  [name]

## 2018-08-17 IMAGING — CT CT NECK W/ CM
3 of 4 series · 13 of 33 positions shown, 16 images · IV contrast (iopamidol)
Comparison: [REDACTED] head CT without contrast
01/08/2016.

CLINICAL DATA: 70-year-old female with possible submandibular
lymphadenopathy. Ilpha-R antitrypsin deficiency.

EXAM:
CT NECK WITH CONTRAST
TECHNIQUE: Multidetector CT imaging of the neck was performed using the
standard protocol following the bolus administration of intravenous
contrast.
CONTRAST:  75mL 3AUIZF-1SS IOPAMIDOL (3AUIZF-1SS) INJECTION 61%

[Series 3: axial neck · axial · 0.50mm/px · z∈[-222,-70]mm · 5 of 115 slices shown, 7 images]
[im 20/115  soft-tissue]
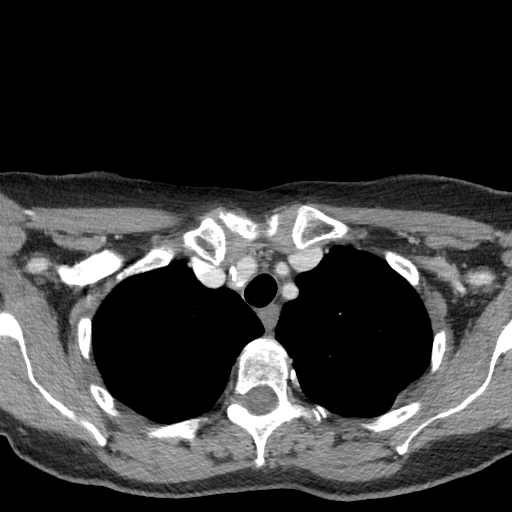
[im 20/115  bone]
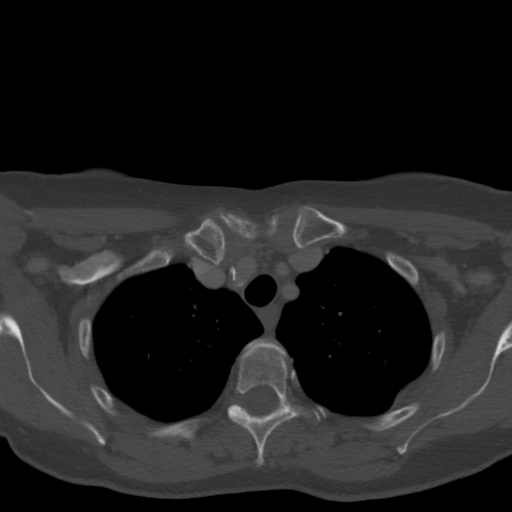
[im 39/115  bone]
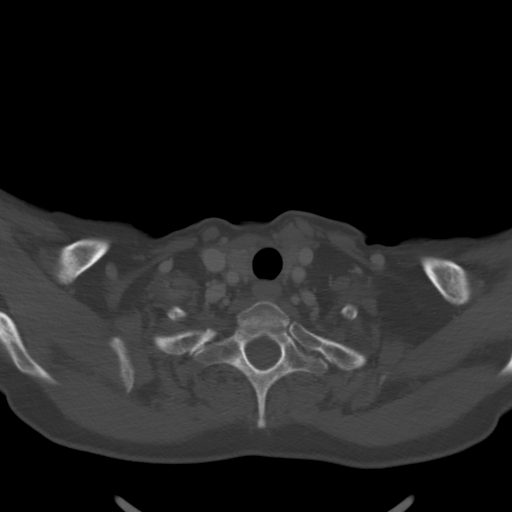
[im 58/115  bone]
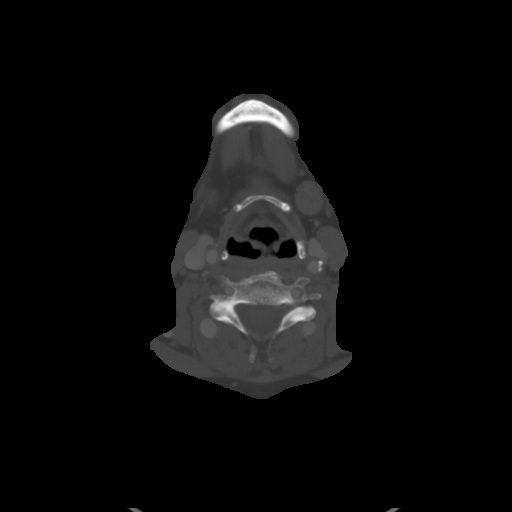
[im 77/115  bone]
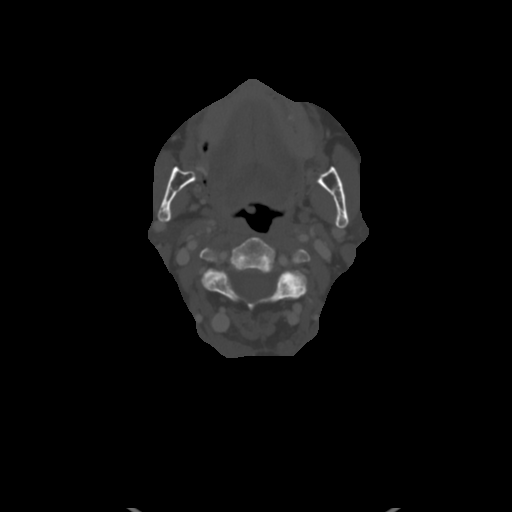
[im 96/115  soft-tissue]
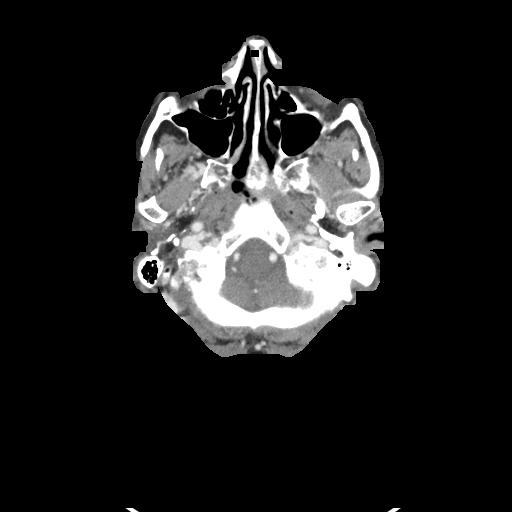
[im 96/115  bone]
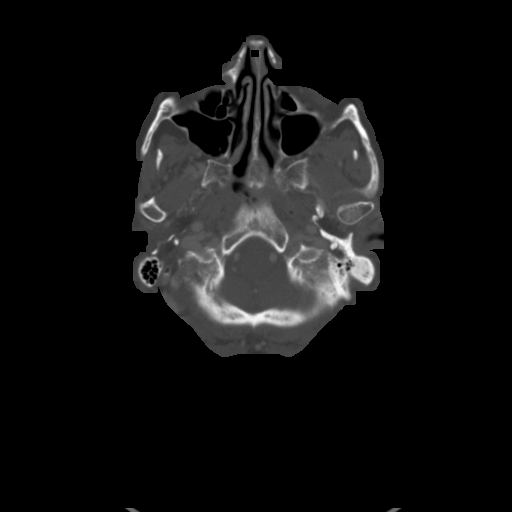

[Series 5: sag neck · sagittal · 0.48mm/px · 5 of 86 slices shown, 6 images]
[im 29/86  bone]
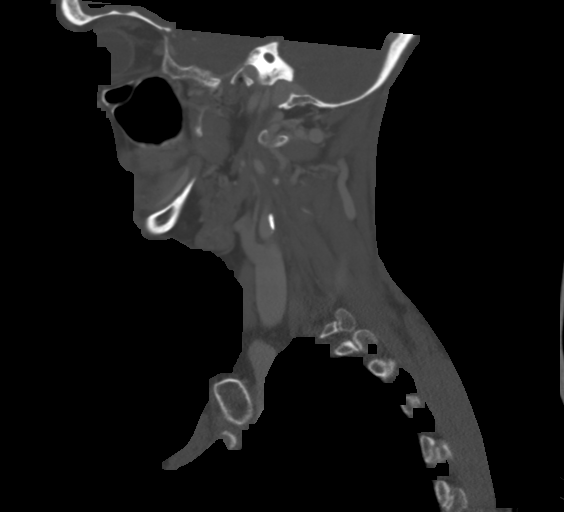
[im 36/86  bone]
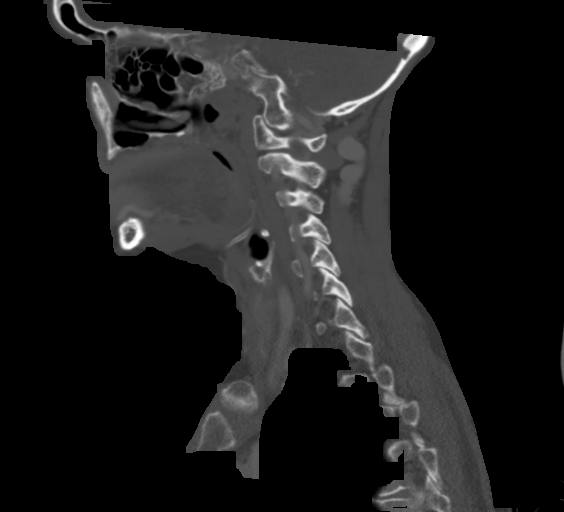
[im 43/86  soft-tissue]
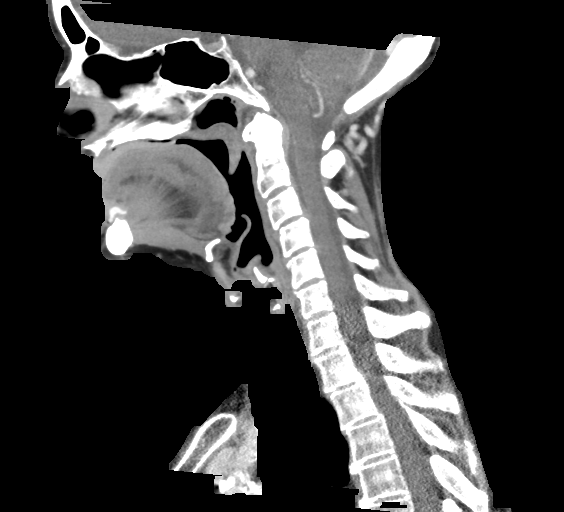
[im 43/86  bone]
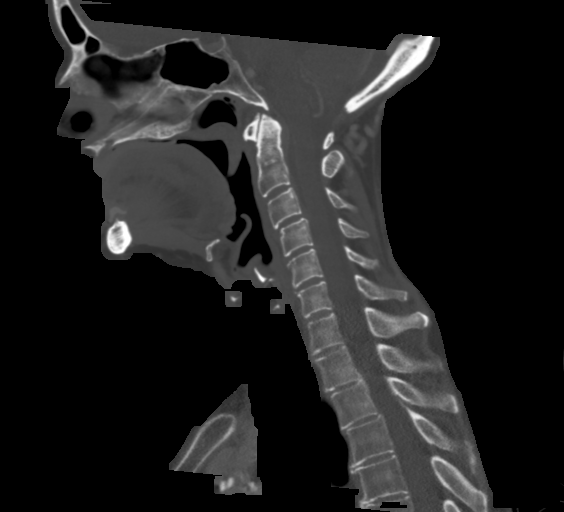
[im 50/86  bone]
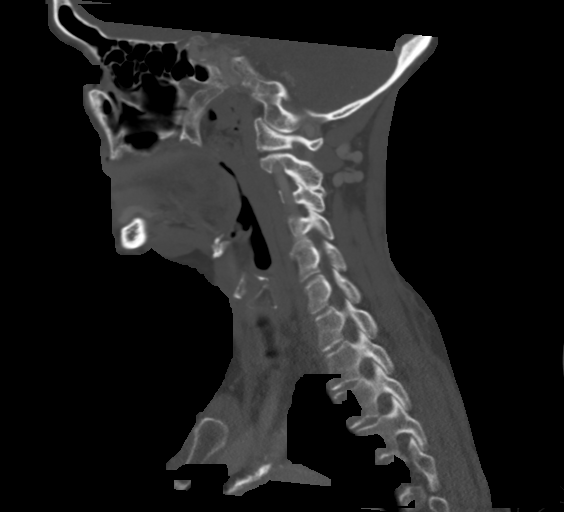
[im 57/86  bone]
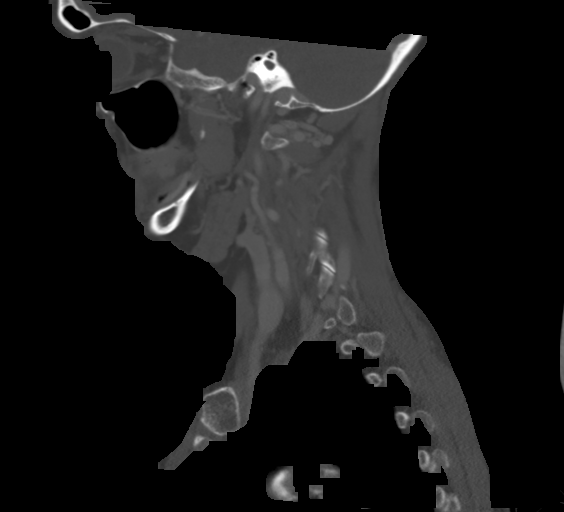

[Series 6: cor neck · coronal · 0.30mm/px · 3 of 124 slices shown]
[im 25/124  bone]
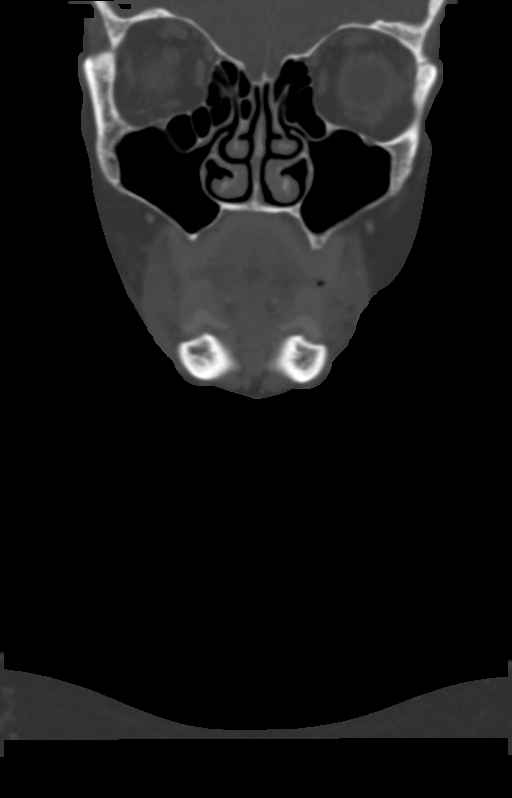
[im 50/124  bone]
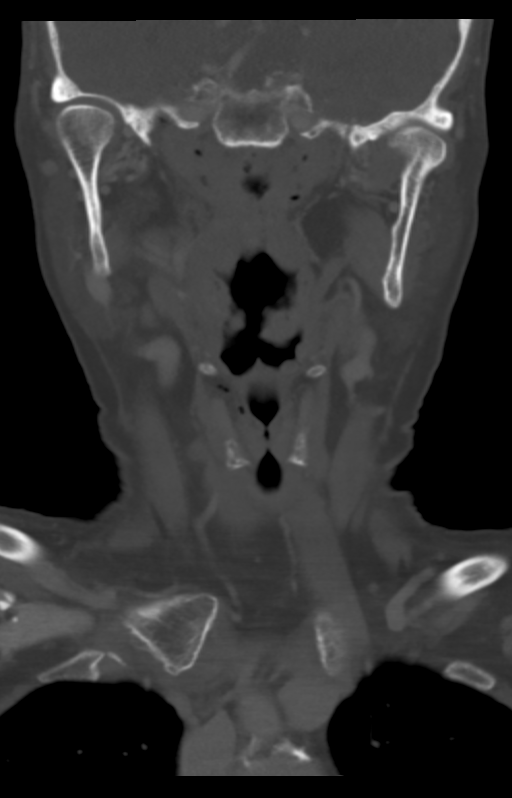
[im 74/124  bone]
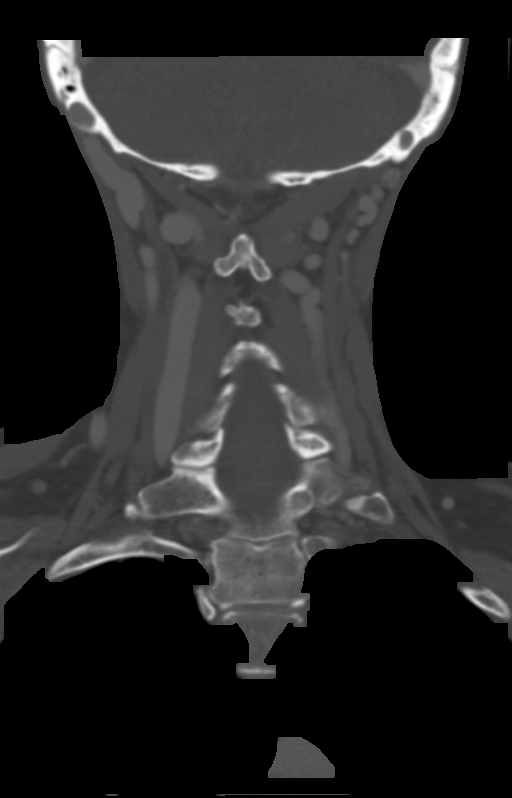

[13 of 33 positions shown; findings below may reference images not displayed]

FINDINGS: Pharynx and larynx: Negative larynx. Pharyngeal soft tissue contours
are within normal limits. There are trace retained secretions at the
nasopharynx. Negative parapharyngeal and retropharyngeal spaces.

Salivary glands: Negative sublingual space. Both submandibular
glands are mildly lobular. The left submandibular gland is mildly
asymmetrically enlarged and/or low lying (series 3 image 58 and
coronal image 57), but there is no discrete left submandibular mass
or inflammation. No sialolithiasis.

Both parotid glands are within normal limits.

Thyroid: Negative.

Lymph nodes: Negative. No cervical lymphadenopathy. The level 1 and
level 2 nodes near the bilateral submandibular glands are diminutive
and normal.

Vascular: Calcified aortic and bilateral carotid artery
atherosclerosis. Some superimposed soft carotid atherosclerotic
plaque. The major vascular structures in the neck and at the
skullbase are patent.

Limited intracranial: Stable and negative.

Visualized orbits: Postoperative changes to the globes, otherwise
negative.

Mastoids and visualized paranasal sinuses: Visualized paranasal
sinuses and mastoids are stable and well pneumatized.

Skeleton: Absent dentition. Mild for age spine degeneration. No
acute osseous abnormality identified.

Upper chest: There is centrilobular emphysema evident in both
visible lungs. No apical bullous disease. No superimposed abnormal
opacity in the visible upper lungs. No superior mediastinal
lymphadenopathy. Calcified aortic atherosclerosis. Visible axillary
lymph nodes are normal.
IMPRESSION: 1. The left submandibular gland is mildly larger and/or low lying
compared to the right, but there is no discrete submandibular gland
mass by CT.
If the left gland corresponds to the palpable area of concern, and
if enlargement continues then follow-up Face or Neck MRI without and
with contrast would further characterize the gland.
2. No cervical lymphadenopathy and otherwise negative neck soft
tissues.
3. Calcified aortic and carotid artery atherosclerosis.
4. Upper lobe centrilobular emphysema.

## 2018-10-26 ENCOUNTER — Other Ambulatory Visit: Payer: Self-pay

## 2018-10-26 ENCOUNTER — Inpatient Hospital Stay: Payer: Medicare HMO | Attending: Hematology & Oncology | Admitting: Hematology & Oncology

## 2018-10-26 ENCOUNTER — Telehealth: Payer: Self-pay | Admitting: *Deleted

## 2018-10-26 ENCOUNTER — Encounter: Payer: Self-pay | Admitting: Hematology & Oncology

## 2018-10-26 ENCOUNTER — Inpatient Hospital Stay: Payer: Medicare HMO

## 2018-10-26 DIAGNOSIS — G300 Alzheimer's disease with early onset: Secondary | ICD-10-CM | POA: Diagnosis not present

## 2018-10-26 LAB — CBC WITH DIFFERENTIAL (CANCER CENTER ONLY)
Abs Immature Granulocytes: 0.02 10*3/uL (ref 0.00–0.07)
BASOS ABS: 0 10*3/uL (ref 0.0–0.1)
Basophils Relative: 1 %
EOS ABS: 0.1 10*3/uL (ref 0.0–0.5)
EOS PCT: 2 %
HEMATOCRIT: 43.3 % (ref 36.0–46.0)
HEMOGLOBIN: 14.3 g/dL (ref 12.0–15.0)
Immature Granulocytes: 0 %
LYMPHS ABS: 2.2 10*3/uL (ref 0.7–4.0)
LYMPHS PCT: 35 %
MCH: 31.9 pg (ref 26.0–34.0)
MCHC: 33 g/dL (ref 30.0–36.0)
MCV: 96.7 fL (ref 80.0–100.0)
MONO ABS: 0.6 10*3/uL (ref 0.1–1.0)
MONOS PCT: 9 %
NRBC: 0 % (ref 0.0–0.2)
Neutro Abs: 3.2 10*3/uL (ref 1.7–7.7)
Neutrophils Relative %: 53 %
Platelet Count: 197 10*3/uL (ref 150–400)
RBC: 4.48 MIL/uL (ref 3.87–5.11)
RDW: 13.1 % (ref 11.5–15.5)
WBC: 6.2 10*3/uL (ref 4.0–10.5)

## 2018-10-26 LAB — FERRITIN: FERRITIN: 52 ng/mL (ref 11–307)

## 2018-10-26 LAB — CMP (CANCER CENTER ONLY)
ALT: 36 U/L (ref 0–44)
AST: 45 U/L — ABNORMAL HIGH (ref 15–41)
Albumin: 3.9 g/dL (ref 3.5–5.0)
Alkaline Phosphatase: 58 U/L (ref 38–126)
Anion gap: 10 (ref 5–15)
BUN: 15 mg/dL (ref 8–23)
CALCIUM: 9.8 mg/dL (ref 8.9–10.3)
CO2: 29 mmol/L (ref 22–32)
Chloride: 104 mmol/L (ref 98–111)
Creatinine: 0.81 mg/dL (ref 0.44–1.00)
GFR, Estimated: >60 mL/min (ref >60)
Glucose, Bld: 92 mg/dL (ref 70–99)
Potassium: 4.2 mmol/L (ref 3.5–5.1)
SODIUM: 143 mmol/L (ref 135–145)
Total Bilirubin: 0.4 mg/dL (ref 0.3–1.2)
Total Protein: 7.5 g/dL (ref 6.5–8.1)

## 2018-10-26 LAB — IRON AND TIBC
Iron: 113 ug/dL (ref 41–142)
Saturation Ratios: 28 % (ref 21–57)
TIBC: 403 ug/dL (ref 236–444)
UIBC: 290 ug/dL (ref 120–384)

## 2018-10-26 NOTE — Telephone Encounter (Signed)
-----   Message from [name], MD sent at 10/26/2018  1:02 PM EST ----- Call - iron level is ok!!  No phlebotomy!!   [name]

## 2018-10-26 NOTE — Progress Notes (Signed)
Hematology and Oncology Follow Up Visit  Veronica Meza [id number] 1946-03-23 72 y.o. 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  She is doing okay.  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  She just has a hard time with her short-term memory.  Last time I saw them, they had gone up to [place] for a high school reunion.  It was the 55th year high school reunion.  It appears that they had a wonderful time up there.  She is looking forward to Thanksgiving.  Family is coming into town.  Only last saw her in August, her ferritin was 43 with an iron saturation of 36%.  We did not have to do a phlebotomy.  She is having no lung issues.  There is no increased cough or shortness of breath.  She has had no fever.  There is been no rashes.  She is had no diarrhea.  She is had no leg swelling.  She has been taking Prolia for a fall in which she sustained some fractures in her sacrum and lumbar spine.  She says the Prolia is helping.  Overall, her performance status is ECOG 2.     Medications:  Allergies as of 10/26/2018      Reactions   Codeine Swelling   Sulfa Antibiotics Itching, Nausea And Vomiting   Other reaction(s): Vomiting (intolerance) Other reaction(s): Vomiting (intolerance)   Alendronate    Other reaction(s): GI Upset (intolerance)   Erythromycin Base Nausea Only   Hydrocodone-acetaminophen Swelling      Medication List        Accurate as of 10/26/18 11:19 AM. Always use your most recent med list.          amLODipine 10 MG tablet Commonly known as:  NORVASC Take 1 tablet (10 mg total) by mouth daily.   atorvastatin 40 MG tablet Commonly known as:  LIPITOR Take 40 mg by mouth daily.   busPIRone 10 MG tablet Commonly known as:  BUSPAR Take 10  mg by mouth 2 (two) times daily.   cromolyn 4 % ophthalmic solution Commonly known as:  OPTICROM PRN allergies.   DOCOSAHEXAENOIC ACID PO Take by mouth daily.   levothyroxine 100 MCG tablet Commonly known as:  SYNTHROID, LEVOTHROID 100 mcg daily.   lisinopril 20 MG tablet Commonly known as:  PRINIVIL,ZESTRIL Take 20 mg by mouth.   loratadine 10 MG tablet Commonly known as:  CLARITIN Take 10 mg by mouth.   mupirocin ointment 2 % Commonly known as:  BACTROBAN Place 1 application into the nose.   TURMERIC PO Take by mouth.       Allergies:  Allergies  Allergen Reactions  . Codeine Swelling  . Sulfa Antibiotics Itching and Nausea And Vomiting    Other reaction(s): Vomiting (intolerance) Other reaction(s): Vomiting (intolerance)  . Alendronate     Other reaction(s): GI Upset (intolerance)  . Erythromycin Base Nausea Only  . Hydrocodone-Acetaminophen Swelling    Past Medical History, Surgical history, Social history, and Family History were reviewed and updated.  Review of Systems: Review of Systems  Constitutional: Negative.   HENT: Negative.   Eyes: Negative.   Respiratory: Negative.   Cardiovascular: Negative.   Gastrointestinal: Negative.   Genitourinary: Negative.   Musculoskeletal: Positive for myalgias.  Skin: Negative.   Neurological: Positive for  tingling.  Endo/Heme/Allergies: Negative.   Psychiatric/Behavioral: Negative.      Physical Exam:  weight is 115 lb (52.2 kg). Her oral temperature is 97.6 F (36.4 C). Her blood pressure is 154/51 (abnormal) and her pulse is 99. Her respiration is 16 and oxygen saturation is 99%.   Wt Readings from Last 3 Encounters:  10/26/18 115 lb (52.2 kg)  08/03/18 110 lb 1.9 oz (50 kg)  04/05/18 112 lb 12 oz (51.1 kg)    Physical Exam  Constitutional: She is oriented to person, place, and time.  HENT:  Head: Normocephalic and atraumatic.  Mouth/Throat: Oropharynx is clear and moist.  Eyes: Pupils are  equal, round, and reactive to light. EOM are normal.  Neck: Normal range of motion.  Cardiovascular: Normal rate, regular rhythm and normal heart sounds.  Pulmonary/Chest: Effort normal and breath sounds normal.  Abdominal: Soft. Bowel sounds are normal.  Musculoskeletal: Normal range of motion. She exhibits no edema, tenderness or deformity.  Lymphadenopathy:    She has no cervical adenopathy.  Neurological: She is alert and oriented to person, place, and time.  Skin: Skin is warm and dry. No rash noted. No erythema.  Psychiatric: She has a normal mood and affect. Her behavior is normal. Judgment and thought content normal.  Vitals reviewed.   Lab Results  Component Value Date   WBC 6.2 10/26/2018   HGB 14.3 10/26/2018   HCT 43.3 10/26/2018   MCV 96.7 10/26/2018   PLT 197 10/26/2018   Lab Results  Component Value Date   FERRITIN 43 08/03/2018   IRON 140 08/03/2018   TIBC 389 08/03/2018   UIBC 249 08/03/2018   IRONPCTSAT 36 08/03/2018   Lab Results  Component Value Date   RBC 4.48 10/26/2018   No results found for: KPAFRELGTCHN, LAMBDASER, KAPLAMBRATIO No results found for: IGGSERUM, IGA, IGMSERUM No results found for: Marda Stalker, SPEI   Chemistry      Component Value Date/Time   NA 141 08/03/2018 1043   NA 148 (H) 11/02/2017 1007   NA 139 04/29/2017 1021   K 3.8 08/03/2018 1043   K 3.9 11/02/2017 1007   K 4.1 04/29/2017 1021   CL 105 08/03/2018 1043   CL 106 11/02/2017 1007   CO2 29 08/03/2018 1043   CO2 30 11/02/2017 1007   CO2 25 04/29/2017 1021   BUN 19 08/03/2018 1043   BUN 9 11/02/2017 1007   BUN 18.8 04/29/2017 1021   CREATININE 0.76 08/03/2018 1043   CREATININE 0.7 11/02/2017 1007   CREATININE 0.8 04/29/2017 1021      Component Value Date/Time   CALCIUM 9.4 08/03/2018 1043   CALCIUM 9.6 11/02/2017 1007   CALCIUM 10.1 04/29/2017 1021   ALKPHOS 60 08/03/2018 1043   ALKPHOS 78 11/02/2017 1007     ALKPHOS 66 04/29/2017 1021   AST 50 (H) 08/03/2018 1043   AST 58 (H) 04/29/2017 1021   ALT 48 (H) 08/03/2018 1043   ALT 46 11/02/2017 1007   ALT 66 (H) 04/29/2017 1021   BILITOT 0.4 08/03/2018 1043   BILITOT 0.46 04/29/2017 1021     Impression and Plan: Veronica Meza is a very pleasant 72 yo caucasian female with alpha-1 anti-trypsin deficiency as well as the heterozygote inheritance of hemochromatosis.   We will see what her iron levels show.  Hopefully, they will still be all right that we will not have to phlebotomize her.  At this point, I think we can probably  get her back in about 3 months or so.  We can get her through the holidays.  I do not think that she is to have any problems from my point of view.  We was phlebotomize her but I would be surprised if she needed a phlebotomy.  Marland Kitchen [name], MD 11/20/201911:19 AM

## 2019-01-17 ENCOUNTER — Other Ambulatory Visit: Payer: Self-pay

## 2019-01-17 ENCOUNTER — Encounter: Payer: Self-pay | Admitting: Family

## 2019-01-17 ENCOUNTER — Inpatient Hospital Stay: Payer: Medicare HMO

## 2019-01-17 ENCOUNTER — Inpatient Hospital Stay: Payer: Medicare HMO | Attending: Hematology & Oncology | Admitting: Family

## 2019-01-17 ENCOUNTER — Other Ambulatory Visit: Payer: Self-pay | Admitting: *Deleted

## 2019-01-17 DIAGNOSIS — E8801 Alpha-1-antitrypsin deficiency: Secondary | ICD-10-CM | POA: Insufficient documentation

## 2019-01-17 DIAGNOSIS — D510 Vitamin B12 deficiency anemia due to intrinsic factor deficiency: Secondary | ICD-10-CM

## 2019-01-17 DIAGNOSIS — D508 Other iron deficiency anemias: Secondary | ICD-10-CM | POA: Diagnosis not present

## 2019-01-17 LAB — CMP (CANCER CENTER ONLY)
ALT: 79 U/L — ABNORMAL HIGH (ref 0–44)
ANION GAP: 9 (ref 5–15)
AST: 79 U/L — ABNORMAL HIGH (ref 15–41)
Albumin: 4.7 g/dL (ref 3.5–5.0)
Alkaline Phosphatase: 58 U/L (ref 38–126)
BUN: 12 mg/dL (ref 8–23)
CO2: 29 mmol/L (ref 22–32)
Calcium: 10.2 mg/dL (ref 8.9–10.3)
Chloride: 102 mmol/L (ref 98–111)
Creatinine: 0.84 mg/dL (ref 0.44–1.00)
GFR, Estimated: >60 mL/min (ref >60)
Glucose, Bld: 116 mg/dL — ABNORMAL HIGH (ref 70–99)
Potassium: 4.4 mmol/L (ref 3.5–5.1)
Sodium: 140 mmol/L (ref 135–145)
Total Bilirubin: 0.6 mg/dL (ref 0.3–1.2)
Total Protein: 7.3 g/dL (ref 6.5–8.1)

## 2019-01-17 LAB — CBC WITH DIFFERENTIAL (CANCER CENTER ONLY)
Abs Immature Granulocytes: 0.03 10*3/uL (ref 0.00–0.07)
BASOS ABS: 0 10*3/uL (ref 0.0–0.1)
Basophils Relative: 1 %
Eosinophils Absolute: 0.2 10*3/uL (ref 0.0–0.5)
Eosinophils Relative: 3 %
HCT: 43.8 % (ref 36.0–46.0)
Hemoglobin: 14.7 g/dL (ref 12.0–15.0)
Immature Granulocytes: 1 %
Lymphocytes Relative: 38 %
Lymphs Abs: 2.2 10*3/uL (ref 0.7–4.0)
MCH: 32.2 pg (ref 26.0–34.0)
MCHC: 33.6 g/dL (ref 30.0–36.0)
MCV: 96.1 fL (ref 80.0–100.0)
Monocytes Absolute: 0.4 10*3/uL (ref 0.1–1.0)
Monocytes Relative: 7 %
NRBC: 0 % (ref 0.0–0.2)
Neutro Abs: 2.9 10*3/uL (ref 1.7–7.7)
Neutrophils Relative %: 50 %
Platelet Count: 155 10*3/uL (ref 150–400)
RBC: 4.56 MIL/uL (ref 3.87–5.11)
RDW: 12.9 % (ref 11.5–15.5)
WBC: 5.7 10*3/uL (ref 4.0–10.5)

## 2019-01-17 NOTE — Progress Notes (Signed)
Hematology and Oncology Follow Up Visit  Veronica Meza [id number] [birth year] 73 y.o. 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 She is doing well and has no complaints at this time.  She states that she recently recuperated from a bout of pneumonia. She denies SOB but still has a dry cough at times. Her voice is still a little hoarse and soft spoken but she notes that this is getting better.  She has not required a phlebotomy for quite a while. Her iron saturation in November was 28% and ferritin 52.  No fever, chills, n/v, cough, rash, dizziness, SOB, chest pain, palpitations, abdominal pain or changes in bowel or bladder habits.  No swelling, tenderness, numbness or tingling in her extremities.  No lymphadenopathy noted on exam.  No episodes of bleeding, no bruising or petechiae.  She is eating well and staying hydrated. Her weight is stable.   ECOG Performance Status: 0 - Asymptomatic  Medications:  Allergies as of 01/17/2019      Reactions   Codeine Swelling   Sulfa Antibiotics Itching, Nausea And Vomiting   Other reaction(s): Vomiting (intolerance) Other reaction(s): Vomiting (intolerance)   Alendronate    Other reaction(s): GI Upset (intolerance)   Erythromycin Base Nausea Only   Hydrocodone-acetaminophen Swelling      Medication List       Accurate as of January 17, 2019 11:52 AM. Always use your most recent med list.        amLODipine 10 MG tablet Commonly known as:  NORVASC Take 1 tablet (10 mg total) by mouth daily.   atorvastatin 40 MG tablet Commonly known as:  LIPITOR Take 40 mg by mouth daily.   busPIRone 10 MG tablet Commonly known as:  BUSPAR Take 10 mg by mouth 2 (two) times daily.   cromolyn 4 % ophthalmic  solution Commonly known as:  OPTICROM PRN allergies.   DOCOSAHEXAENOIC ACID PO Take by mouth daily.   levothyroxine 100 MCG tablet Commonly known as:  SYNTHROID, LEVOTHROID 100 mcg daily.   lisinopril 20 MG tablet Commonly known as:  PRINIVIL,ZESTRIL Take 20 mg by mouth.   loratadine 10 MG tablet Commonly known as:  CLARITIN Take 10 mg by mouth.   mupirocin ointment 2 % Commonly known as:  BACTROBAN Place 1 application into the nose.   TURMERIC PO Take by mouth.       Allergies:  Allergies  Allergen Reactions  . Codeine Swelling  . Sulfa Antibiotics Itching and Nausea And Vomiting    Other reaction(s): Vomiting (intolerance) Other reaction(s): Vomiting (intolerance)  . Alendronate     Other reaction(s): GI Upset (intolerance)  . Erythromycin Base Nausea Only  . Hydrocodone-Acetaminophen Swelling    Past Medical History, Surgical history, Social history, and Family History were reviewed and updated.  Review of Systems: All other 10 point review of systems is negative.   Physical Exam:  vitals were not taken for this visit.   Wt Readings from Last 3 Encounters:  10/26/18 115 lb (52.2 kg)  08/03/18 110 lb 1.9 oz (50 kg)  04/05/18 112 lb 12 oz (51.1 kg)    Ocular: Sclerae unicteric, pupils equal, round and reactive to light Ear-nose-throat: Oropharynx clear, dentition fair Lymphatic: No cervical, supraclavicular or axillary adenopathy Lungs no rales or rhonchi, good excursion bilaterally Heart regular rate and  rhythm, no murmur appreciated Abd soft, nontender, positive bowel sounds, no liver or spleen tip palpated on exam, no fluid wave  MSK no focal spinal tenderness, no joint edema Neuro: non-focal, well-oriented, appropriate affect Breasts: Deferred   Lab Results  Component Value Date   WBC 5.7 01/17/2019   HGB 14.7 01/17/2019   HCT 43.8 01/17/2019   MCV 96.1 01/17/2019   PLT 155 01/17/2019   Lab Results  Component Value Date   FERRITIN 52  10/26/2018   IRON 113 10/26/2018   TIBC 403 10/26/2018   UIBC 290 10/26/2018   IRONPCTSAT 28 10/26/2018   Lab Results  Component Value Date   RBC 4.56 01/17/2019   No results found for: KPAFRELGTCHN, LAMBDASER, KAPLAMBRATIO No results found for: IGGSERUM, IGA, IGMSERUM No results found for: Georgann Housekeeper, MSPIKE, SPEI   Chemistry      Component Value Date/Time   NA 143 10/26/2018 0953   NA 148 (H) 11/02/2017 1007   NA 139 04/29/2017 1021   K 4.2 10/26/2018 0953   K 3.9 11/02/2017 1007   K 4.1 04/29/2017 1021   CL 104 10/26/2018 0953   CL 106 11/02/2017 1007   CO2 29 10/26/2018 0953   CO2 30 11/02/2017 1007   CO2 25 04/29/2017 1021   BUN 15 10/26/2018 0953   BUN 9 11/02/2017 1007   BUN 18.8 04/29/2017 1021   CREATININE 0.81 10/26/2018 0953   CREATININE 0.7 11/02/2017 1007   CREATININE 0.8 04/29/2017 1021      Component Value Date/Time   CALCIUM 9.8 10/26/2018 0953   CALCIUM 9.6 11/02/2017 1007   CALCIUM 10.1 04/29/2017 1021   ALKPHOS 58 10/26/2018 0953   ALKPHOS 78 11/02/2017 1007   ALKPHOS 66 04/29/2017 1021   AST 45 (H) 10/26/2018 0953   AST 58 (H) 04/29/2017 1021   ALT 36 10/26/2018 0953   ALT 46 11/02/2017 1007   ALT 66 (H) 04/29/2017 1021   BILITOT 0.4 10/26/2018 0953   BILITOT 0.46 04/29/2017 1021       Impression and Plan: VeronicaFieldsis avery pleasant 73 yo caucasian female with alpha-1 anti-trypsin deficiency as well as the heterozygote inheritance of 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  She is doing well and has no complaints at this time. Hgb is stable at 14.7.  We will see what her iron studies show and bring her back in for phlebotomy if needed.  We will plan to see her back in another 3 months.  They will contact our office with nay questions of concerns. We can certainly see her sooner if need be.   Emeline Gins, NP 2/11/202011:52 AM

## 2019-01-18 LAB — FERRITIN: Ferritin: 63 ng/mL (ref 11–307)

## 2019-01-18 LAB — IRON AND TIBC
Iron: 153 ug/dL — ABNORMAL HIGH (ref 41–142)
SATURATION RATIOS: 40 % (ref 21–57)
TIBC: 384 ug/dL (ref 236–444)
UIBC: 231 ug/dL (ref 120–384)

## 2019-02-15 ENCOUNTER — Telehealth: Payer: Self-pay

## 2019-02-15 NOTE — Telephone Encounter (Signed)
Copied from CRM [id number]. Topic: Appointment Scheduling - Scheduling Inquiry for Clinic >> Feb 15, 2019  2:02 PM Arlyss Gandy, NT wrote: Reason for CRM: Pts daughter calling to schedule mom for a new pt appt with Dr. Dayton Martes. She states Dr. Dayton Martes approved this via Mychart conversation with her, Georga Bora. I am unable to book until next year. Please advise with this pt. Daughter states to call mom to schedule.

## 2019-02-15 NOTE — Telephone Encounter (Signed)
Dr. Dayton Martes agreed to see this patient and her husband/thx dmf

## 2019-02-17 NOTE — Telephone Encounter (Signed)
Patient is scheduled for 4.6.20 @ 1pm and will be here at 12:45pm/her husband will become new patient when he brings her/he will schedule then/thx dmf

## 2019-02-23 ENCOUNTER — Other Ambulatory Visit: Payer: Medicare HMO

## 2019-02-23 ENCOUNTER — Ambulatory Visit: Payer: Medicare HMO | Admitting: Family

## 2019-03-13 ENCOUNTER — Ambulatory Visit: Payer: Medicare HMO | Admitting: Family Medicine

## 2019-04-10 ENCOUNTER — Ambulatory Visit: Payer: Self-pay

## 2019-04-10 NOTE — Telephone Encounter (Signed)
Pt. Called to report feeling light headed, fatigue, and low BP.  Stated the light headed and fatigue have been occurring over several days.  Notices the feeling of fatigue and light-headed when doing house work.  Reported she checked her BP this morning: 114/51.  Stated she feels "weak", and feels "light headed "in head and chest."  Denied chest pain.  Stated she feels like she has to deliberately take a breath,  when she has the episodes of feeling light in the chest.  Denied cough.  Denied fever, but reported intermittent hot and cold spells.  Daughter on phone, and reported pt. Has intermittent wheeze.  Also, daughter reported that the pt. Had pneumonia several months ago, and doesn't think the pt. has fully recovered.  Asked pt. to repeat her BP at this time; at 11:45 AM- BP 163/71, pulse 62; at 11:49 AM- BP 109/64, pulse 64.  Daughter reported that the pt. has had hx of variation in BP readings, when her thyroid was not regulated well.  Has an appt. to establish care with Dr. Dayton Martes in June.  Called [facility]; transferred to a Scheduler to get a new patient appt. with PCP.  Pt. and daughter agreed with plan.        Reason for Disposition . [1] MODERATE dizziness (e.g., interferes with normal activities) AND [2] has NOT been evaluated by physician for this  (Exception: dizziness caused by heat exposure, sudden standing, or poor fluid intake)  Answer Assessment - Initial Assessment Questions 1. DESCRIPTION: "Describe your dizziness."     Feels weak, light-headed in head and chest 2. LIGHTHEADED: "Do you feel lightheaded?" (e.g., somewhat faint, woozy, weak upon standing)    Has felt faint 3. VERTIGO: "Do you feel like either you or the room is spinning or tilting?" (i.e. vertigo)     Denied  4. SEVERITY: "How bad is it?"  "Do you feel like you are going to faint?" "Can you stand and walk?"   - MILD - walking normally   - MODERATE - interferes with normal activities (e.g., work, school)    - SEVERE - unable  to stand, requires support to walk, feels like passing out now.      Moderate 5. ONSET:  "When did the dizziness begin?"     Several days ago  6. AGGRAVATING FACTORS: "Does anything make it worse?" (e.g., standing, change in head position)     With doing manual labor  7. HEART RATE: "Can you tell me your heart rate?" "How many beats in 15 seconds?"  (Note: not all patients can do this)       BP 163/71, pulse 62 @ 1145 AM; 109/64, pulse 64 @ 11:49  8. CAUSE: "What do you think is causing the dizziness?"     Unknown; has hx of pneumonia (December or January)  and does not think she has fully recovered.  9. RECURRENT SYMPTOM: "Have you had dizziness before?" If so, ask: "When was the last time?" "What happened that time?"     Has not had these symptoms previously  10. OTHER SYMPTOMS: "Do you have any other symptoms?" (e.g., fever, chest pain, vomiting, diarrhea, bleeding)       Shortness of breath, feels like she is light-headed in head and chest; c/o feeling hot and then cold; hasn't checked temp.  11. PREGNANCY: "Is there any chance you are pregnant?" "When was your last menstrual period?"       N/a  Protocols used: DIZZINESS [facility]

## 2019-04-11 ENCOUNTER — Ambulatory Visit (INDEPENDENT_AMBULATORY_CARE_PROVIDER_SITE_OTHER): Payer: Medicare HMO | Admitting: Family Medicine

## 2019-04-11 ENCOUNTER — Encounter: Payer: Self-pay | Admitting: Family Medicine

## 2019-04-11 ENCOUNTER — Ambulatory Visit (INDEPENDENT_AMBULATORY_CARE_PROVIDER_SITE_OTHER): Payer: Medicare HMO

## 2019-04-11 ENCOUNTER — Other Ambulatory Visit: Payer: Medicare HMO

## 2019-04-11 ENCOUNTER — Other Ambulatory Visit: Payer: Self-pay

## 2019-04-11 DIAGNOSIS — E039 Hypothyroidism, unspecified: Secondary | ICD-10-CM

## 2019-04-11 DIAGNOSIS — R531 Weakness: Secondary | ICD-10-CM

## 2019-04-11 DIAGNOSIS — I10 Essential (primary) hypertension: Secondary | ICD-10-CM | POA: Diagnosis not present

## 2019-04-11 DIAGNOSIS — J189 Pneumonia, unspecified organism: Secondary | ICD-10-CM

## 2019-04-11 DIAGNOSIS — R42 Dizziness and giddiness: Secondary | ICD-10-CM

## 2019-04-11 DIAGNOSIS — E785 Hyperlipidemia, unspecified: Secondary | ICD-10-CM | POA: Diagnosis not present

## 2019-04-11 LAB — CBC WITH DIFFERENTIAL/PLATELET
Basophils Absolute: 0 10*3/uL (ref 0.0–0.1)
Basophils Relative: 0.6 % (ref 0.0–3.0)
Eosinophils Absolute: 0.1 10*3/uL (ref 0.0–0.7)
Eosinophils Relative: 2 % (ref 0.0–5.0)
HCT: 42.1 % (ref 36.0–46.0)
Hemoglobin: 14.4 g/dL (ref 12.0–15.0)
Lymphocytes Relative: 34.5 % (ref 12.0–46.0)
Lymphs Abs: 1.9 10*3/uL (ref 0.7–4.0)
MCHC: 34.2 g/dL (ref 30.0–36.0)
MCV: 94.5 fl (ref 78.0–100.0)
Monocytes Absolute: 0.4 10*3/uL (ref 0.1–1.0)
Monocytes Relative: 8 % (ref 3.0–12.0)
Neutro Abs: 3 10*3/uL (ref 1.4–7.7)
Neutrophils Relative %: 54.9 % (ref 43.0–77.0)
Platelets: 134 10*3/uL — ABNORMAL LOW (ref 150.0–400.0)
RBC: 4.45 Mil/uL (ref 3.87–5.11)
RDW: 14.5 % (ref 11.5–15.5)
WBC: 5.5 10*3/uL (ref 4.0–10.5)

## 2019-04-11 LAB — COMPREHENSIVE METABOLIC PANEL
ALT: 52 U/L — ABNORMAL HIGH (ref 0–35)
AST: 65 U/L — ABNORMAL HIGH (ref 0–37)
Albumin: 4.3 g/dL (ref 3.5–5.2)
Alkaline Phosphatase: 60 U/L (ref 39–117)
BUN: 13 mg/dL (ref 6–23)
CO2: 30 mEq/L (ref 19–32)
Calcium: 9.6 mg/dL (ref 8.4–10.5)
Chloride: 102 mEq/L (ref 96–112)
Creatinine, Ser: 0.71 mg/dL (ref 0.40–1.20)
GFR: 80.39 mL/min (ref >60.00)
Glucose, Bld: 107 mg/dL — ABNORMAL HIGH (ref 70–99)
Potassium: 4.4 mEq/L (ref 3.5–5.1)
Sodium: 141 mEq/L (ref 135–145)
Total Bilirubin: 0.6 mg/dL (ref 0.2–1.2)
Total Protein: 7.4 g/dL (ref 6.0–8.3)

## 2019-04-11 LAB — TSH: TSH: 1.74 u[IU]/mL (ref 0.35–4.50)

## 2019-04-11 LAB — T4, FREE: Free T4: 0.79 ng/dL (ref 0.60–1.60)

## 2019-04-11 NOTE — Addendum Note (Signed)
Addended by: Varney Biles on: 04/11/2019 10:28 AM   Modules accepted: Orders

## 2019-04-11 NOTE — Assessment & Plan Note (Signed)
>  30 minutes spent in face to face time with patient, her husband and grandson, >50% spent in counselling or coordination of care discussing dizziness, HTN, h/o CAP. She has been taking HCTZ and lisinopril both in the morning and that is when her BP drops.  I advised that she continue taking HCTZ in the morning but start taking lisinopril in the evening. We will also bring her in today for lab work and CXR for further work up of her weakness and dizziness. The patient indicates understanding of these issues and agrees with the plan. Orders Placed This Encounter  Procedures  . DG Chest 2 View  . CBC with Differential/Platelet  . TSH  . T4, free  . Comprehensive metabolic panel

## 2019-04-11 NOTE — Progress Notes (Signed)
Virtual Visit via Video   Due to the COVID-19 pandemic, this visit was completed with telemedicine (audio/video) technology to reduce patient and provider exposure as well as to preserve personal protective equipment.   I connected with Veronica HurtSusan H Meza by a video enabled telemedicine application and verified that I am speaking with the correct person using two identifiers. Location patient: Home Location provider: [facility], Office Persons participating in the virtual visit: Cyril LoosenSusan H Dohn, Ruthe Mannanalia Elroy Schembri, MD   I discussed the limitations of evaluation and management by telemedicine and the availability of in person appointments. The patient expressed understanding and agreed to proceed.  Care Team   Patient Care Team: Elijio MilesWeaver, John W., MD as PCP - General (Family Medicine)  Subjective:   HPI:  Patient is new to me.  Nurse triage note from yesterday stated the following:  Pt. Called to report feeling light headed, fatigue, and low BP.  Stated the light headed and fatigue have been occurring over several days.  Notices the feeling of fatigue and light-headed when doing house work.  Reported she checked her BP this morning: 114/51.  Stated she feels "weak", and feels "light headed "in head and chest."  Denied chest pain.  Stated she feels like she has to deliberately take a breath,  when she has the episodes of feeling light in the chest.  Denied cough.  Denied fever, but reported intermittent hot and cold spells.  Daughter on phone, and reported pt. Has intermittent wheeze.  Also, daughter reported that the pt. Had pneumonia several months ago, and doesn't think the pt. has fully recovered.  Asked pt. to repeat her BP at this time; at 11:45 AM- BP 163/71, pulse 62; at 11:49 AM- BP 109/64, pulse 64.  Daughter reported that the pt. has had hx of variation in BP readings, when her thyroid was not regulated well.  Has an appt. to establish care with Dr. Dayton MartesAron in June.  Called [facility]; transferred to a  Scheduler to get a new patient appt. with PCP.  Pt. and daughter agreed with plan.    Chart reviewed- she is currently HCTZ 25 mg daily and lisinopril 40 mg daily for HTN.  Amlodipine was stopped due to LE edema.  BP Readings from Last 3 Encounters:  01/17/19 (!) 149/57  10/26/18 (!) 154/51  08/03/18 (!) 131/48   Lab Results  Component Value Date   CREATININE 0.84 01/17/2019    Also taking synthroid 75 mcg for hypothyroidism.  Lab Results  Component Value Date   TSH 0.760 02/01/2018   Did have CAP in January- notes reviewed- admitted overnight to hospital and treated with 5 days total of abx.  She has had a persistent wheeze since that time.  No CP or SOB.  Review of Systems  Constitutional: Positive for malaise/fatigue. Negative for fever.  HENT: Negative.   Eyes: Negative.   Respiratory: Positive for wheezing.   Cardiovascular: Negative.   Gastrointestinal: Negative.   Genitourinary: Negative.  Negative for dysuria.  Musculoskeletal: Negative.   Neurological: Positive for dizziness and weakness. Negative for tingling, tremors, sensory change, speech change, focal weakness, seizures, loss of consciousness and headaches.  Psychiatric/Behavioral: Negative.   All other systems reviewed and are negative.    Patient Active Problem List   Diagnosis Date Noted  . HTN (hypertension) 04/11/2019  . HLD (hyperlipidemia) 04/11/2019  . Orthostatic dizziness 04/11/2019  . CAP (community acquired pneumonia) 04/11/2019  . Hypothyroidism 04/11/2019  . Weakness 04/11/2019  . Hereditary hemochromatosis (HCC)  02/05/2017  . AAT (alpha-1-antitrypsin) deficiency (HCC) 01/13/2017    Social History   Tobacco Use  . Smoking status: Former Smoker    Packs/day: 1.00    Years: 15.00    Pack years: 15.00    Types: Cigarettes    Last attempt to quit: 01/13/2010    Years since quitting: 9.2  . Smokeless tobacco: Never Used  Substance Use Topics  . Alcohol use: No    Current Outpatient  Medications:  .  atorvastatin (LIPITOR) 40 MG tablet, Take 40 mg by mouth daily., Disp: , Rfl:  .  busPIRone (BUSPAR) 10 MG tablet, Take 10 mg by mouth 2 (two) times daily as needed. , Disp: , Rfl:  .  cromolyn (OPTICROM) 4 % ophthalmic solution, PRN allergies., Disp: , Rfl:  .  gabapentin (NEURONTIN) 300 MG capsule, Take 300 mg by mouth 3 (three) times daily., Disp: , Rfl:  .  hydrochlorothiazide (HYDRODIURIL) 25 MG tablet, Take 25 mg by mouth daily., Disp: , Rfl:  .  levothyroxine (SYNTHROID) 75 MCG tablet, Take 75 mcg by mouth daily., Disp: , Rfl:  .  lisinopril (ZESTRIL) 40 MG tablet, Take 40 mg by mouth daily., Disp: , Rfl:  .  loratadine (CLARITIN) 10 MG tablet, Take 10 mg by mouth., Disp: , Rfl:  .  Mag Aspart-Potassium Aspart (POTASSIUM & MAGNESIUM ASPARTAT PO), Take 1 tablet by mouth daily., Disp: , Rfl:  .  DOCOSAHEXAENOIC ACID PO, Take by mouth daily., Disp: , Rfl:   Allergies  Allergen Reactions  . Alendronate Nausea Only  . Codeine Other (See Comments)    REACTION: Generalized swelling  . Hydrocodone-Acetaminophen Swelling    REACTION: Generalized swelling   . Erythromycin Base Nausea Only  . Sulfa Antibiotics Itching and Nausea And Vomiting    Objective:  There were no vitals taken for this visit. No data found.    VITALS: Per patient if applicable, see vitals. GENERAL: Alert, appears well and in no acute distress. HEENT: Atraumatic, conjunctiva clear, no obvious abnormalities on inspection of external nose and ears. NECK: Normal movements of the head and neck. CARDIOPULMONARY: No increased WOB. Speaking in clear sentences. I:E ratio WNL.  MS: Moves all visible extremities without noticeable abnormality. PSYCH: Pleasant and cooperative, well-groomed. Speech normal rate and rhythm. Affect is appropriate. Insight and judgement are appropriate. Attention is focused, linear, and appropriate.  NEURO: CN grossly intact. Oriented as arrived to appointment on time with no  prompting. Moves both UE equally.  SKIN: No obvious lesions, wounds, erythema, or cyanosis noted on face or hands.  No flowsheet data found.  Assessment and Plan:   Veronica Meza was seen today for hypertension.  Diagnoses and all orders for this visit:  Essential hypertension  Hyperlipidemia, unspecified hyperlipidemia type  Orthostatic dizziness  Community acquired pneumonia, unspecified laterality -     DG Chest 2 View; Future  Hypothyroidism, unspecified type -     TSH; Future -     T4, free; Future  Weakness -     CBC with Differential/Platelet; Future -     Comprehensive metabolic panel; Future    . COVID-19 Education: The signs and symptoms of COVID-19 were discussed with the patient and how to seek care for testing if needed. The importance of social distancing was discussed today. . Reviewed expectations re: course of current medical issues. . Discussed self-management of symptoms. . Outlined signs and symptoms indicating need for more acute intervention. . Patient verbalized understanding and all questions were answered. [facility]  Maintenance issues including appropriate healthy diet, exercise, and smoking avoidance were discussed with patient. . See orders for this visit as documented in the electronic medical record.  Ruthe Mannan, MD  Records requested if needed. Time spent: 30 minutes, of which >50% was spent in obtaining information about her symptoms, reviewing her previous labs, evaluations, and treatments, counseling her about her condition (please see the discussed topics above), and developing a plan to further investigate it; she had a number of questions which I addressed.

## 2019-04-19 ENCOUNTER — Ambulatory Visit: Payer: Self-pay

## 2019-04-19 ENCOUNTER — Telehealth: Payer: Self-pay

## 2019-04-19 ENCOUNTER — Encounter: Payer: Self-pay | Admitting: Family Medicine

## 2019-04-19 NOTE — Telephone Encounter (Signed)
-----   Message from Dianne Dun, MD sent at 04/12/2019  7:49 AM EDT ----- Please call pt- Labs look reassuring- Thyroid function normal, idney function looks great.  Liver function appears to be unchanged from a year ago, blood sugar looks good, platelets a little low but otherwise CBC looks good- I do want to repeat a CBC in 2 months.  CXR looks consistent with a resolving pneumonia but the radiologist did not have her previous xray to compare it to.  We may want to repeat it again in a month or so (when she is here for her establish care appt).  How is she feeling today?  How has her blood pressure been?

## 2019-04-19 NOTE — Telephone Encounter (Signed)
Provided lab results per Dr.                                              Provided lab result per  Dr. Dayton Martes from 04/12/19.  Patient voiced understanding. Made future appointment for next appointment.                                 aron

## 2019-04-19 NOTE — Telephone Encounter (Signed)
PEC-I LMOVM for pt to RTC as her lab results are in/we will have to repeat a lab test in 2 months as well as a CXR in 1 month/created phone note  PEC ok to give lab results, create lab visit in 1 month for repate CXR and lab visit for 2 months for CBC/thx dmf

## 2019-04-20 ENCOUNTER — Other Ambulatory Visit: Payer: Self-pay | Admitting: Family Medicine

## 2019-04-20 DIAGNOSIS — R413 Other amnesia: Secondary | ICD-10-CM

## 2019-04-21 ENCOUNTER — Inpatient Hospital Stay (HOSPITAL_BASED_OUTPATIENT_CLINIC_OR_DEPARTMENT_OTHER): Payer: Medicare HMO | Admitting: Hematology & Oncology

## 2019-04-21 ENCOUNTER — Other Ambulatory Visit: Payer: Self-pay

## 2019-04-21 ENCOUNTER — Inpatient Hospital Stay: Payer: Medicare HMO | Attending: Hematology & Oncology

## 2019-04-21 ENCOUNTER — Encounter: Payer: Self-pay | Admitting: Hematology & Oncology

## 2019-04-21 VITALS — BP 156/63 | HR 62 | Temp 98.6°F | Resp 16 | Wt 117.0 lb

## 2019-04-21 DIAGNOSIS — E8801 Alpha-1-antitrypsin deficiency: Secondary | ICD-10-CM

## 2019-04-21 LAB — CBC WITH DIFFERENTIAL (CANCER CENTER ONLY)
Abs Immature Granulocytes: 0.03 10*3/uL (ref 0.00–0.07)
Basophils Absolute: 0 10*3/uL (ref 0.0–0.1)
Basophils Relative: 1 %
Eosinophils Absolute: 0.1 10*3/uL (ref 0.0–0.5)
Eosinophils Relative: 2 %
HCT: 41.6 % (ref 36.0–46.0)
Hemoglobin: 14.1 g/dL (ref 12.0–15.0)
Immature Granulocytes: 1 %
Lymphocytes Relative: 36 %
Lymphs Abs: 2.1 10*3/uL (ref 0.7–4.0)
MCH: 32.3 pg (ref 26.0–34.0)
MCHC: 33.9 g/dL (ref 30.0–36.0)
MCV: 95.2 fL (ref 80.0–100.0)
Monocytes Absolute: 0.5 10*3/uL (ref 0.1–1.0)
Monocytes Relative: 9 %
Neutro Abs: 2.9 10*3/uL (ref 1.7–7.7)
Neutrophils Relative %: 51 %
Platelet Count: 151 10*3/uL (ref 150–400)
RBC: 4.37 MIL/uL (ref 3.87–5.11)
RDW: 13.7 % (ref 11.5–15.5)
WBC Count: 5.7 10*3/uL (ref 4.0–10.5)
nRBC: 0 % (ref 0.0–0.2)

## 2019-04-21 LAB — CMP (CANCER CENTER ONLY)
ALT: 53 U/L — ABNORMAL HIGH (ref 0–44)
AST: 65 U/L — ABNORMAL HIGH (ref 15–41)
Albumin: 4.4 g/dL (ref 3.5–5.0)
Alkaline Phosphatase: 55 U/L (ref 38–126)
Anion gap: 9 (ref 5–15)
BUN: 15 mg/dL (ref 8–23)
CO2: 30 mmol/L (ref 22–32)
Calcium: 9.7 mg/dL (ref 8.9–10.3)
Chloride: 101 mmol/L (ref 98–111)
Creatinine: 0.78 mg/dL (ref 0.44–1.00)
GFR, Est AFR Am: >60 mL/min (ref >60)
GFR, Estimated: >60 mL/min (ref >60)
Glucose, Bld: 111 mg/dL — ABNORMAL HIGH (ref 70–99)
Potassium: 4 mmol/L (ref 3.5–5.1)
Sodium: 140 mmol/L (ref 135–145)
Total Bilirubin: 0.7 mg/dL (ref 0.3–1.2)
Total Protein: 7.1 g/dL (ref 6.5–8.1)

## 2019-04-21 NOTE — Progress Notes (Signed)
Hematology and Oncology Follow Up Visit  SHAMAYA SCHUSSLER [id number] 1946/01/14 73 y.o. 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 Bourdon is here today for follow-up.  She is doing pretty well.  Thankfully, she has not had any problems with the coronavirus.  She is still been very cautious.  She really is not leaving her house.  When I saw her in February, her ferritin was 63 with an iron saturation of 40%.  She does have little bit of pain over on the right side.  This may have been where she had pneumonia.  She could have had a little pleurisy from the pneumonia back in the wintertime.  She has had no cough.  She has had no pain.  She has had no change in bowel or bladder habits.     ECOG Performance Status: 0 - Asymptomatic  Medications:  Allergies as of 04/21/2019      Reactions   Alendronate Nausea Only   Codeine Other (See Comments)   REACTION: Generalized swelling   Hydrocodone-acetaminophen Swelling   REACTION: Generalized swelling   Erythromycin Base Nausea Only   Sulfa Antibiotics Itching, Nausea And Vomiting      Medication List       Accurate as of Apr 21, 2019 11:23 AM. If you have any questions, ask your nurse or doctor.        atorvastatin 40 MG tablet Commonly known as:  LIPITOR Take 40 mg by mouth daily.   busPIRone 10 MG tablet Commonly known as:  BUSPAR Take 10 mg by mouth 2 (two) times daily as needed.   cromolyn 4 % ophthalmic solution Commonly known as:  OPTICROM PRN allergies.   DOCOSAHEXAENOIC ACID PO Take by mouth daily.   gabapentin 300 MG capsule Commonly known as:  NEURONTIN Take 300 mg by mouth 3 (three) times daily.   hydrochlorothiazide 25 MG tablet Commonly known as:  HYDRODIURIL Take 25 mg by mouth daily.   levothyroxine 75 MCG tablet Commonly known  as:  SYNTHROID Take 75 mcg by mouth daily.   lisinopril 40 MG tablet Commonly known as:  ZESTRIL Take 40 mg by mouth daily.   loratadine 10 MG tablet Commonly known as:  CLARITIN Take 10 mg by mouth.   POTASSIUM & MAGNESIUM ASPARTAT PO Take 1 tablet by mouth daily.       Allergies:  Allergies  Allergen Reactions   Alendronate Nausea Only   Codeine Other (See Comments)    REACTION: Generalized swelling   Hydrocodone-Acetaminophen Swelling    REACTION: Generalized swelling    Erythromycin Base Nausea Only   Sulfa Antibiotics Itching and Nausea And Vomiting    Past Medical History, Surgical history, Social history, and Family History were reviewed and updated.  Review of Systems: Review of Systems  Constitutional: Negative.   HENT: Negative.   Eyes: Negative.   Respiratory: Negative.   Cardiovascular: Negative.   Gastrointestinal: Negative.   Genitourinary: Negative.   Musculoskeletal: Negative.   Skin: Negative.   Neurological: Negative.   Endo/Heme/Allergies: Negative.   Psychiatric/Behavioral: Negative.    Marland Kitchen   Physical Exam:  weight is 117 lb (53.1 kg). Her oral temperature is 98.6 F (37 C). Her blood pressure is 156/63 (abnormal) and her pulse is 62. Her respiration is 16 and oxygen saturation is 99%.   Wt Readings from Last 3 Encounters:  04/21/19 117  lb (53.1 kg)  01/17/19 112 lb 12.8 oz (51.2 kg)  10/26/18 115 lb (52.2 kg)    Ocular: Sclerae unicteric, pupils equal, round and reactive to light Ear-nose-throat: Oropharynx clear, dentition fair Lymphatic: No cervical, supraclavicular or axillary adenopathy Lungs no rales or rhonchi, good excursion bilaterally Heart regular rate and rhythm, no murmur appreciated Abd soft, nontender, positive bowel sounds, no liver or spleen tip palpated on exam, no fluid wave  MSK no focal spinal tenderness, no joint edema Neuro: non-focal, well-oriented, appropriate affect Breasts: Deferred   Lab Results    Component Value Date   WBC 5.7 04/21/2019   HGB 14.1 04/21/2019   HCT 41.6 04/21/2019   MCV 95.2 04/21/2019   PLT 151 04/21/2019   Lab Results  Component Value Date   FERRITIN 63 01/17/2019   IRON 153 (H) 01/17/2019   TIBC 384 01/17/2019   UIBC 231 01/17/2019   IRONPCTSAT 40 01/17/2019   Lab Results  Component Value Date   RBC 4.37 04/21/2019   No results found for: KPAFRELGTCHN, LAMBDASER, KAPLAMBRATIO No results found for: IGGSERUM, IGA, IGMSERUM No results found for: Georgann Housekeeper, MSPIKE, SPEI   Chemistry      Component Value Date/Time   NA 140 04/21/2019 0951   NA 148 (H) 11/02/2017 1007   NA 139 04/29/2017 1021   K 4.0 04/21/2019 0951   K 3.9 11/02/2017 1007   K 4.1 04/29/2017 1021   CL 101 04/21/2019 0951   CL 106 11/02/2017 1007   CO2 30 04/21/2019 0951   CO2 30 11/02/2017 1007   CO2 25 04/29/2017 1021   BUN 15 04/21/2019 0951   BUN 9 11/02/2017 1007   BUN 18.8 04/29/2017 1021   CREATININE 0.78 04/21/2019 0951   CREATININE 0.7 11/02/2017 1007   CREATININE 0.8 04/29/2017 1021      Component Value Date/Time   CALCIUM 9.7 04/21/2019 0951   CALCIUM 9.6 11/02/2017 1007   CALCIUM 10.1 04/29/2017 1021   ALKPHOS 55 04/21/2019 0951   ALKPHOS 78 11/02/2017 1007   ALKPHOS 66 04/29/2017 1021   AST 65 (H) 04/21/2019 0951   AST 58 (H) 04/29/2017 1021   ALT 53 (H) 04/21/2019 0951   ALT 46 11/02/2017 1007   ALT 66 (H) 04/29/2017 1021   BILITOT 0.7 04/21/2019 0951   BILITOT 0.46 04/29/2017 1021       Impression and Plan: Ms.Fieldsis avery pleasant 73 yo caucasian female with alpha-1 anti-trypsin deficiency as well as the heterozygote inheritance of 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   We will have to see what her iron levels show.  We certainly can phlebotomize her if we need to.  We will plan to get her back in another 3 months.  I think this would be reasonable.  [name], MD 5/15/202011:23 AM

## 2019-04-24 LAB — IRON AND TIBC
Iron: 149 ug/dL — ABNORMAL HIGH (ref 41–142)
Saturation Ratios: 40 % (ref 21–57)
TIBC: 371 ug/dL (ref 236–444)
UIBC: 222 ug/dL (ref 120–384)

## 2019-04-24 LAB — FERRITIN: Ferritin: 68 ng/mL (ref 11–307)

## 2019-05-16 ENCOUNTER — Ambulatory Visit: Payer: Medicare HMO | Admitting: Family Medicine

## 2019-05-19 DIAGNOSIS — G300 Alzheimer's disease with early onset: Secondary | ICD-10-CM | POA: Insufficient documentation

## 2019-05-19 DIAGNOSIS — F0280 Dementia in other diseases classified elsewhere without behavioral disturbance: Secondary | ICD-10-CM | POA: Insufficient documentation

## 2019-05-19 NOTE — Patient Instructions (Addendum)
Great to see you. I will call you with your results from today and you can view them online.   

## 2019-05-19 NOTE — Progress Notes (Signed)
Subjective:   Patient ID: Veronica Meza, female    DOB: 08/20/1946, 73 y.o.   MRN: [id number]  Veronica Meza is a very pleasant 73 y.o. year old female with complicated medical history, including alpha 1 antitripsyn deficiency, hereditary hemochromatosis, HLD, HTN, hypothyroidism, who presents to clinic today with New Patient (Initial Visit) (Pt screened at vehicle.  Pt is here today for a NP visit. )  on 05/22/2019  HPI:  Chart reviewed extensively.  Last had wellness visit in 09/2018.  I worked her in to be seen via televisit on 05/01/19 prior to her new patient appointment today because I received the following triage message the day prior, 04/10/19:  "Pt. Called to report feeling light headed, fatigue, and low BP.  Stated the light headed and fatigue have been occurring over several days.  Notices the feeling of fatigue and light-headed when doing house work.  Reported she checked her BP this morning: 114/51.  Stated she feels "weak", and feels "light headed "in head and chest."  Denied chest pain.  Stated she feels like she has to deliberately take a breath,  when she has the episodes of feeling light in the chest.  Denied cough.  Denied fever, but reported intermittent hot and cold spells.  Daughter on phone, and reported pt. Has intermittent wheeze.  Also, daughter reported that the pt. Had pneumonia several months ago, and doesn't think the pt. has fully recovered.  Asked pt. to repeat her BP at this time; at 11:45 AM- BP 163/71, pulse 62; at 11:49 AM- BP 109/64, pulse 64.  Daughter reported that the pt. has had hx of variation in BP readings, when her thyroid was not regulated well.  Has an appt. to establish care with Dr. Dayton Meza in June.  Called [facility]; transferred to a Scheduler to get a new patient appt. with PCP.  Pt. and daughter agreed with plan. "       HTN- at the time of her virtual visit on 04/11/19, she was taking HCTZ 25 mg daily and lisinopril 40 mg daily for HTN.  Amlodipine was  stopped due to LE edema.  Lab Results  Component Value Date   CREATININE 0.78 04/21/2019   BP Readings from Last 3 Encounters:  05/22/19 (!) 148/64  04/21/19 (!) 156/63  01/17/19 (!) 149/57    From previous PCP's notes: 01/11/19 142/62  12/20/18 118/60  12/12/18 122/48    Also taking synthroid 75 mcg for hypothyroidism. Lab Results  Component Value Date   TSH 1.74 04/11/2019     Also was hospitalized in January 2020 for CAP -- notes reviewed- admitted overnight to hospital and treated with 5 days total of abx.  She has had a persistent wheeze since that time.  No CP.  Still having some SOB.   Weakness- we ordered CBC, CMET, TSH and FT4 along with CXR to follow up PNA.  Lab Results  Component Value Date   WBC 5.7 04/21/2019   HGB 14.1 04/21/2019   HCT 41.6 04/21/2019   MCV 95.2 04/21/2019   PLT 151 04/21/2019   Lab Results  Component Value Date   ALT 53 (H) 04/21/2019   AST 65 (H) 04/21/2019   ALKPHOS 55 04/21/2019   BILITOT 0.7 04/21/2019   Lab Results  Component Value Date   NA 140 04/21/2019   K 4.0 04/21/2019   CL 101 04/21/2019   CO2 30 04/21/2019   Lab Results  Component Value Date   CREATININE 0.78 04/21/2019  CXR from 04/11/19 was reassuring showing signs of resolving PNA-  CHEST - 2 VIEW  COMPARISON:  Prior chest x-ray from 01/20 is not available for comparison.  FINDINGS: There may be a background of emphysematous changes bilaterally. Atherosclerotic changes are noted of the thoracic aorta. The cardiac silhouette is unremarkable. No pneumothorax. No large area of consolidation. There is a slightly greater than expected density in the right infrahilar region which may represent a resolving pneumonia in the appropriate clinical setting.  IMPRESSION: Right infrahilar airspace opacity may represent a resolving pneumonia. The patient's prior chest x-ray is not available for comparison.   Hemochromatosis and Alpha 1 antitrypsin  deficiency.  Pt feels Alpha 1 was diagnosed 10-15 years ago.  She last saw Veronica Meza on 04/21/19.  Note reviewed. 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  Note reviewed.  Memory issues- Veronica Meza actually has Alzheimer's dementia without behavioral disturbance, unspecified timing of dementia onset (HCC) on her problem list and recommended establishing with a neurologist.  She then sent me a my chart message stating the following:  "Dear Veronica Meza,  Thank you for the results of my blood study.  All of the results look great.  Veronica Meza I have been going to Veronica Meza for hemochromatosis for the past several years.  I have an appointment with Veronica Meza on Friday to check my iron levels.  I am still having short term memory loss, I was scheduled to see a Neurologist but have since left that practice.  I feel it is in my best interest for you to make referrals on my behalf to a neurologist or other appropriate physicians.  I have done batteries of testing and also had an MRI that did not show any abnormalities.  The testing concluded onset-dementia.  I hope this finds you well and I look forward to your response.  Thank you in advance.  Veronica Meza"  I therefore placed a neurology referral on 04/20/19.  She has an appointment to see Veronica Meza, neurologist on 06/05/19.  In reviewing Care Everywhere, on 07/07/18, she had an initial consult for Late onset Alzheimer's with Veronica Meza, a board certified neuropsychologist based in [facility].  Note reviewed.   His assessment was as follows: SUMMARY & IMPRESSION: Veronica Meza is a 73 year old, right-handed,  Caucasian female, who reported experiencing worsening cognitive difficulties over the past year. Onset was insidious with no precipitating factors endorsed, though she does purportedly feel that memory improved after her statin was discontinued.  Test results revealed intact functioning across most cognitive domains and thinking skills assessed during this evaluation with the exception of severely impaired learning and memory and reduced language functions including confrontation naming and category fluency; there were also indications of mild executive softening. From an emotional standpoint, there are no major indications of clinical psychopathology or behavioral disturbance.  Mrs. Fabela' performance revealed major neurocognitive deficits consistent with a probable early stage dementia process. The pattern of these results, and the history, are most concerning for Alzheimer's disease as the etiology.   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 Taking med daily as Rx. Tolerating medication well. No side effects.  Denies chest pain, SOB, myalgias, claudication, vision loss.   Lab results he obtained that day were:  Component Value Date  CHOL 165 01/09/2019  TRIG 216 (H) 01/09/2019  HDL 46 01/09/2019  LDL 92 16/10/960402/02/2019   Hypothyroidism- has been clinically euthyroid on synthroid 75 mcg daily.  Lab Results  Component Value Date   TSH 1.74 04/11/2019   Osteoporosis- she states that she is receiving prolia twice yearly but in her old records reviewed it appears she was on Boniva as recently as 11/2018 despite her staying she has been on Prolia for years.  Current Outpatient Medications on File Prior to Visit  Medication Sig Dispense Refill  . atorvastatin (LIPITOR) 40 MG tablet Take 40 mg by mouth daily.    .  busPIRone (BUSPAR) 10 MG tablet Take 10 mg by mouth 2 (two) times daily as needed.     . Cholecalciferol (VITAMIN D3) 125 MCG (5000 UT) TABS Take by mouth.    . gabapentin (NEURONTIN) 300 MG capsule Take 300 mg by mouth 3 (three) times daily.    . hydrochlorothiazide (HYDRODIURIL) 25 MG tablet Take 25 mg by mouth daily.    Marland Kitchen. levothyroxine (SYNTHROID) 75 MCG tablet Take 75 mcg by mouth daily.    Marland Kitchen. lisinopril (ZESTRIL) 40 MG tablet Take 40 mg by mouth daily.    Marland Kitchen. loratadine (CLARITIN) 10 MG tablet Take 10 mg by mouth.    . Mag Aspart-Potassium Aspart (POTASSIUM & MAGNESIUM ASPARTAT PO) Take 1 tablet by mouth daily.    . cromolyn (OPTICROM) 4 % ophthalmic solution PRN allergies.    . DOCOSAHEXAENOIC ACID PO Take by mouth daily.     No current facility-administered medications on file prior to visit.     Allergies  Allergen Reactions  . Alendronate Nausea Only  . Codeine Other (See Comments)    REACTION: Generalized swelling  . Hydrocodone-Acetaminophen Swelling    REACTION: Generalized swelling   . Erythromycin Base Nausea Only  . Sulfa Antibiotics Itching and Nausea And Vomiting    Past Medical History:  Diagnosis Date  . AAT (alpha-1-antitrypsin) deficiency (HCC) 01/13/2017  . Hypertension     No past surgical history on file.  No family history on file.  Social History   Socioeconomic History  . Marital status: Married    Spouse name: Not on file  . Number of children: Not on file  . Years of education: Not on file  . Highest education level: Not on file  Occupational History  . Not on file  Social Needs  . Financial resource strain: Not on file  . Food insecurity    Worry: Not on file    Inability: Not on file  . Transportation needs    Medical: Not on file    Non-medical: Not on file  Tobacco Use  . Smoking status: Former Smoker    Packs/day: 1.00    Years: 15.00    Pack years: 15.00    Types: Cigarettes    Quit date: 01/13/2010    Years since quitting: 9.3   . Smokeless tobacco: Never Used  Substance and Sexual Activity  . Alcohol use: No  . Drug use: No  . Sexual activity: Not on file  Lifestyle  . Physical activity    Days per week: Not on file    Minutes per session: Not on  file  . Stress: Not on file  Relationships  . Social Herbalist on phone: Not on file    Gets together: Not on file    Attends religious service: Not on file    Active member of club or organization: Not on file    Attends meetings of clubs or organizations: Not on file    Relationship status: Not on file  . Intimate partner violence    Fear of current or ex partner: Not on file    Emotionally abused: Not on file    Physically abused: Not on file    Forced sexual activity: Not on file  Other Topics Concern  . Not on file  Social History Narrative  . Not on file   The PMH, PSH, Social History, Family History, Medications, and allergies have been reviewed in [facility], and have been updated if relevant.     Review of Systems  Constitutional: Negative.   HENT: Negative.   Respiratory: Positive for shortness of breath. Negative for apnea, cough, choking, chest tightness, wheezing and stridor.   Cardiovascular: Negative for chest pain.  Gastrointestinal: Negative.   Genitourinary: Negative.   Musculoskeletal: Negative.   Neurological: Negative for dizziness, weakness and headaches.       + memory issues- she feels it is getting worse.  Hematological: Negative.   Psychiatric/Behavioral: Negative.   All other systems reviewed and are negative.      Objective:    BP (!) 148/64   Pulse 61   Temp 98.5 F (36.9 C) (Oral)   Ht 5\' 2"  (1.575 m)   Wt 118 lb 6.4 oz (53.7 kg)   SpO2 97%   BMI 21.66 kg/m    Physical Exam        Assessment & Plan:   AAT (alpha-1-antitrypsin) deficiency ([place]) - Plan:   Community acquired pneumonia of right lower lobe of lung ([place]) - Plan: DG Chest 2 View,   Hereditary hemochromatosis ([place]) - Plan: CBC  with Differential/Platelet, Ferritin, Iron Binding Cap (TIBC)([facility]),   Mixed hyperlipidemia - Plan:   Essential hypertension - Plan:   Other specified hypothyroidism - Plan:   Orthostatic dizziness - Plan:   Weakness - Plan:  Early onset Alzheimer's dementia without behavioral disturbance ([place]) - Plan:   Elevated liver enzymes - Plan: Hepatic function panel,   Osteoporosis with current pathological fracture, unspecified osteoporosis type, initial encounter - Plan:  Vitamin D deficiency - Plan:  No follow-ups on file.  Orders Placed This Encounter  Procedures  . DG Chest 2 View  . CBC with Differential/Platelet  . Ferritin  . Iron Binding Cap (TIBC)([facility])  . Hepatic function panel  . Vitamin D (25 hydroxy)  . TSH  . T4, free

## 2019-05-21 DIAGNOSIS — M810 Age-related osteoporosis without current pathological fracture: Secondary | ICD-10-CM | POA: Insufficient documentation

## 2019-05-21 DIAGNOSIS — E559 Vitamin D deficiency, unspecified: Secondary | ICD-10-CM | POA: Insufficient documentation

## 2019-05-22 ENCOUNTER — Ambulatory Visit (INDEPENDENT_AMBULATORY_CARE_PROVIDER_SITE_OTHER): Payer: Medicare HMO

## 2019-05-22 ENCOUNTER — Encounter: Payer: Self-pay | Admitting: Family Medicine

## 2019-05-22 ENCOUNTER — Other Ambulatory Visit: Payer: Self-pay

## 2019-05-22 ENCOUNTER — Ambulatory Visit (INDEPENDENT_AMBULATORY_CARE_PROVIDER_SITE_OTHER): Payer: Medicare HMO | Admitting: Family Medicine

## 2019-05-22 VITALS — BP 148/64 | HR 61 | Temp 98.5°F | Ht 62.0 in | Wt 118.4 lb

## 2019-05-22 DIAGNOSIS — G300 Alzheimer's disease with early onset: Secondary | ICD-10-CM

## 2019-05-22 DIAGNOSIS — E782 Mixed hyperlipidemia: Secondary | ICD-10-CM | POA: Diagnosis not present

## 2019-05-22 DIAGNOSIS — M8000XA Age-related osteoporosis with current pathological fracture, unspecified site, initial encounter for fracture: Secondary | ICD-10-CM

## 2019-05-22 DIAGNOSIS — J189 Pneumonia, unspecified organism: Secondary | ICD-10-CM

## 2019-05-22 DIAGNOSIS — I10 Essential (primary) hypertension: Secondary | ICD-10-CM

## 2019-05-22 DIAGNOSIS — E038 Other specified hypothyroidism: Secondary | ICD-10-CM

## 2019-05-22 DIAGNOSIS — E559 Vitamin D deficiency, unspecified: Secondary | ICD-10-CM

## 2019-05-22 DIAGNOSIS — R531 Weakness: Secondary | ICD-10-CM

## 2019-05-22 DIAGNOSIS — F0280 Dementia in other diseases classified elsewhere without behavioral disturbance: Secondary | ICD-10-CM

## 2019-05-22 DIAGNOSIS — R748 Abnormal levels of other serum enzymes: Secondary | ICD-10-CM

## 2019-05-22 DIAGNOSIS — E8801 Alpha-1-antitrypsin deficiency: Secondary | ICD-10-CM

## 2019-05-22 DIAGNOSIS — J181 Lobar pneumonia, unspecified organism: Secondary | ICD-10-CM

## 2019-05-22 DIAGNOSIS — R42 Dizziness and giddiness: Secondary | ICD-10-CM

## 2019-05-22 NOTE — Assessment & Plan Note (Signed)
Check vitamin D today. 

## 2019-05-22 NOTE — Assessment & Plan Note (Signed)
She feels this is improving.  She is exercising more.

## 2019-05-22 NOTE — Assessment & Plan Note (Addendum)
>  45 minutes spent in face to face time with patient, >50% spent in counselling or coordination of care discussing her chronic medical conditions including  alpha 1 antitripsyn deficiency, hereditary hemochromatosis, HLD, HTN, hypothyroidism, along with follow up CAP.  She is not currently on inhalers for AAT.   Liver function appears stable.  Will repeat CMET today. Lab Results  Component Value Date   ALT 53 (H) 04/21/2019   AST 65 (H) 04/21/2019   ALKPHOS 55 04/21/2019   BILITOT 0.7 04/21/2019

## 2019-05-22 NOTE — Assessment & Plan Note (Signed)
Continues to improve. CXR today to confirm resolution of PNA.

## 2019-05-22 NOTE — Assessment & Plan Note (Signed)
Followed by Dr. Marin Olp. Lab Results  Component Value Date   WBC 5.7 04/21/2019   HGB 14.1 04/21/2019   HCT 41.6 04/21/2019   MCV 95.2 04/21/2019   PLT 151 04/21/2019   Lab Results  Component Value Date   FERRITIN 68 04/21/2019

## 2019-05-22 NOTE — Assessment & Plan Note (Signed)
Referred her to neurologist per pt question. Has appointment schedule with Dr. Krista Blue on 06/05/19.

## 2019-05-22 NOTE — Assessment & Plan Note (Signed)
She is complaint with zocor 40 mg daily. Last had lipid panel checked by previous PCP on 01/09/19 and lipids were controlled at that time.   CHOL 165 01/09/2019  TRIG 216 (H) 01/09/2019  HDL 46 01/09/2019  LDL 92 01/09/2019

## 2019-05-22 NOTE — Assessment & Plan Note (Signed)
Patient is certain she is receiving prolia twice yearly but I cannot find this in her old records- confusion about whether she is taking Boniva or Prolia.  I have asked my CMA to contact her previous PCP, Dr. Kathlen Mody to clarify this. The patient indicates understanding of these issues and agrees with the plan.

## 2019-05-23 LAB — CBC WITH DIFFERENTIAL/PLATELET
Absolute Monocytes: 323 cells/uL (ref 200–950)
Basophils Absolute: 42 cells/uL (ref 0–200)
Basophils Relative: 1.1 %
Eosinophils Absolute: 91 cells/uL (ref 15–500)
Eosinophils Relative: 2.4 %
HCT: 40.3 % (ref 35.0–45.0)
Hemoglobin: 14.2 g/dL (ref 11.7–15.5)
Lymphs Abs: 1053 cells/uL (ref 850–3900)
MCH: 31.4 pg (ref 27.0–33.0)
MCHC: 35.2 g/dL (ref 32.0–36.0)
MCV: 89.2 fL (ref 80.0–100.0)
MPV: 11.6 fL (ref 7.5–12.5)
Monocytes Relative: 8.5 %
Neutro Abs: 2291 cells/uL (ref 1500–7800)
Neutrophils Relative %: 60.3 %
Platelets: 188 10*3/uL (ref 140–400)
RBC: 4.52 10*6/uL (ref 3.80–5.10)
RDW: 12.8 % (ref 11.0–15.0)
Total Lymphocyte: 27.7 %
WBC: 3.8 10*3/uL (ref 3.8–10.8)

## 2019-05-23 LAB — HEPATIC FUNCTION PANEL
AG Ratio: 1.5 (calc) (ref 1.0–2.5)
ALT: 59 U/L — ABNORMAL HIGH (ref 6–29)
AST: 77 U/L — ABNORMAL HIGH (ref 10–35)
Albumin: 4.6 g/dL (ref 3.6–5.1)
Alkaline phosphatase (APISO): 66 U/L (ref 37–153)
Bilirubin, Direct: 0.1 mg/dL (ref 0.0–0.2)
Globulin: 3 g/dL (calc) (ref 1.9–3.7)
Indirect Bilirubin: 0.4 mg/dL (calc) (ref 0.2–1.2)
Total Bilirubin: 0.5 mg/dL (ref 0.2–1.2)
Total Protein: 7.6 g/dL (ref 6.1–8.1)

## 2019-05-23 LAB — IRON, TOTAL/TOTAL IRON BINDING CAP
%SAT: 29 % (calc) (ref 16–45)
Iron: 115 ug/dL (ref 45–160)
TIBC: 399 mcg/dL (calc) (ref 250–450)

## 2019-05-23 LAB — VITAMIN D 25 HYDROXY (VIT D DEFICIENCY, FRACTURES): Vit D, 25-Hydroxy: 51 ng/mL (ref 30–100)

## 2019-05-23 LAB — FERRITIN: Ferritin: 87 ng/mL (ref 16–288)

## 2019-06-05 ENCOUNTER — Encounter: Payer: Self-pay | Admitting: Neurology

## 2019-06-05 ENCOUNTER — Ambulatory Visit: Payer: Medicare HMO | Admitting: Neurology

## 2019-06-05 ENCOUNTER — Other Ambulatory Visit: Payer: Self-pay

## 2019-06-05 ENCOUNTER — Telehealth: Payer: Self-pay | Admitting: Neurology

## 2019-06-05 VITALS — BP 123/67 | HR 61 | Temp 98.0°F | Ht 62.0 in | Wt 117.0 lb

## 2019-06-05 DIAGNOSIS — R413 Other amnesia: Secondary | ICD-10-CM | POA: Diagnosis not present

## 2019-06-05 DIAGNOSIS — E8801 Alpha-1-antitrypsin deficiency: Secondary | ICD-10-CM | POA: Diagnosis not present

## 2019-06-05 NOTE — Progress Notes (Signed)
PATIENT: Veronica Meza DOB: 1946-02-14  Chief Complaint  Patient presents with  . Memory Loss    MMSE 24/30 - 4 animals. She is here with her husband, Veronica Meza. Reports a slow decline in her memory (both short and long term).  She has days that are clearer than others.  She is easily overwhelmed.  She is lives at home with her husband and grandson.  Her husband has Multiple Sclerosis.  Marland Kitchen PCP    Dianne Dun, MD     HISTORICAL  Veronica Meza is 73 year old female, seen in request by her primary care physician Dr. Dayton Martes, Jovita Gamma for evaluation of memory loss, she is accompanied by her husband at today's clinic visit.  I have reviewed and summarized the referring note from the referring physician.  She had past medical history of hypertension, hyperlipidemia, hypothyroidism, on supplement, also has alpha 1 antitrypsin deficiency presented with abnormal liver function, family history of similar disease, her other 4 sisters died with memory loss, in their 49s and 14s.   She is retired Youth worker, was noted to have gradual onset memory loss since 2015, gradually getting worse, now she lives at home with her husband, is to repeat herself, still driving, but got lost while driving,  She also complains of fatigue, not sleeping well, repeating conversations, difficulty following up with conversations, her husband is in wheelchair, suffered multiple sclerosis for more than 20 years.  Laboratory evaluations showed normal ferritin, TIBC of 384, CBC, CMP with AST of 45  REVIEW OF SYSTEMS: Full 14 system review of systems performed and notable only for as above All other review of systems were negative.  ALLERGIES: Allergies  Allergen Reactions  . Alendronate Nausea Only  . Codeine Other (See Comments)    REACTION: Generalized swelling  . Hydrocodone-Acetaminophen Swelling    REACTION: Generalized swelling   . Erythromycin Base Nausea Only  . Sulfa Antibiotics Itching and  Nausea And Vomiting    HOME MEDICATIONS: Current Outpatient Medications  Medication Sig Dispense Refill  . atorvastatin (LIPITOR) 40 MG tablet Take 40 mg by mouth daily.    . busPIRone (BUSPAR) 10 MG tablet Take 10 mg by mouth 2 (two) times daily as needed.     . Cholecalciferol (VITAMIN D3) 125 MCG (5000 UT) TABS Take by mouth.    . gabapentin (NEURONTIN) 300 MG capsule Take 300 mg by mouth 3 (three) times daily.    . hydrochlorothiazide (HYDRODIURIL) 25 MG tablet Take 25 mg by mouth daily.    Marland Kitchen levothyroxine (SYNTHROID) 75 MCG tablet Take 75 mcg by mouth daily.    Marland Kitchen lisinopril (ZESTRIL) 40 MG tablet Take 40 mg by mouth daily.    Marland Kitchen loratadine (CLARITIN) 10 MG tablet Take 10 mg by mouth.    . Mag Aspart-Potassium Aspart (POTASSIUM & MAGNESIUM ASPARTAT PO) Take 1 tablet by mouth daily.     No current facility-administered medications for this visit.     PAST MEDICAL HISTORY: Past Medical History:  Diagnosis Date  . AAT (alpha-1-antitrypsin) deficiency (HCC) 01/13/2017  . Hypertension   . Memory loss     PAST SURGICAL HISTORY: Past Surgical History:  Procedure Laterality Date  . APPENDECTOMY    . ELBOW SURGERY Right   . PORT WINE STAIN REMOVAL W/ LASER      FAMILY HISTORY: Family History  Problem Relation Age of Onset  . Stroke Mother   . Heart disease Father     SOCIAL HISTORY: Social  History   Socioeconomic History  . Marital status: Married    Spouse name: Not on file  . Number of children: 3  . Years of education: some college  . Highest education level: Not on file  Occupational History  . Occupation: retired  Scientific laboratory technician  . Financial resource strain: Not on file  . Food insecurity    Worry: Not on file    Inability: Not on file  . Transportation needs    Medical: Not on file    Non-medical: Not on file  Tobacco Use  . Smoking status: Former Smoker    Packs/day: 1.00    Years: 15.00    Pack years: 15.00    Types: Cigarettes    Quit date: 01/13/2010     Years since quitting: 9.3  . Smokeless tobacco: Never Used  Substance and Sexual Activity  . Alcohol use: No  . Drug use: No  . Sexual activity: Not on file  Lifestyle  . Physical activity    Days per week: Not on file    Minutes per session: Not on file  . Stress: Not on file  Relationships  . Social Herbalist on phone: Not on file    Gets together: Not on file    Attends religious service: Not on file    Active member of club or organization: Not on file    Attends meetings of clubs or organizations: Not on file    Relationship status: Not on file  . Intimate partner violence    Fear of current or ex partner: Not on file    Emotionally abused: Not on file    Physically abused: Not on file    Forced sexual activity: Not on file  Other Topics Concern  . Not on file  Social History Narrative   Lives at home with husband and grandson.   Right-handed.   Occasional caffeine use.     PHYSICAL EXAM   Vitals:   06/05/19 1304  BP: 123/67  Pulse: 61  Temp: 98 F (36.7 C)  Weight: 117 lb (53.1 kg)  Height: 5\' 2"  (1.575 m)    Not recorded      Body mass index is 21.4 kg/m.  PHYSICAL EXAMNIATION:  Gen: NAD, conversant, well nourised, obese, well groomed                     Cardiovascular: Regular rate rhythm, no peripheral edema, warm, nontender. Eyes: Conjunctivae clear without exudates or hemorrhage Neck: Supple, no carotid bruits. Pulmonary: Clear to auscultation bilaterally   NEUROLOGICAL EXAM: MMSE - Mini Mental State Exam 06/05/2019  Orientation to time 2  Orientation to Place 5  Registration 3  Attention/ Calculation 3  Recall 2  Language- name 2 objects 2  Language- repeat 1  Language- follow 3 step command 3  Language- read & follow direction 1  Write a sentence 1  Copy design 1  Total score 24     CRANIAL NERVES: CN II: Visual Fitzgerald are full to confrontation.  Pupils are round equal and briskly reactive to light. CN III, IV, VI:  extraocular movement are normal. No ptosis. CN V: Facial sensation is intact to pinprick in all 3 divisions bilaterally. Corneal responses are intact.  CN VII: Face is symmetric with normal eye closure and smile. CN VIII: Hearing is normal to rubbing fingers CN IX, X: Palate elevates symmetrically. Phonation is normal. CN XI: Head turning and shoulder shrug are intact  CN XII: Tongue is midline with normal movements and no atrophy.  MOTOR: There is no pronator drift of out-stretched arms. Muscle bulk and tone are normal. Muscle strength is normal.  REFLEXES: Reflexes are 2+ and symmetric at the biceps, triceps, knees, and ankles. Plantar responses are flexor.  SENSORY: Intact to light touch, pinprick, positional sensation and vibratory sensation are intact in fingers and toes.  COORDINATION: Rapid alternating movements and fine finger movements are intact. There is no dysmetria on finger-to-nose and heel-knee-shin.    GAIT/STANCE: Posture is normal. Gait is steady with normal steps, base, arm swing, and turning. Heel and toe walking are normal. Tandem gait is normal.  Romberg is absent.   DIAGNOSTIC DATA (LABS, IMAGING, TESTING) - I reviewed patient records, labs, notes, testing and imaging myself where available.   ASSESSMENT AND PLAN  Cyril LoosenSusan H Wrenn is a 73 y.o. female   Memory loss  Strong family history of dementia, also family history of alpha 1 antitrypsin deficiency  Most likely central nervous system degenerative disorder  Mini-Mental Status Examination 24 out of 30 today  MRI of brain  Laboratory evaluations  Return to clinic in 3 to 4 months   Levert FeinsteinYijun Aluna Whiston, M.D. Ph.D.  [facility] [address], [address] [place], [place] [address] Ph: [phone]) [phone] Fax: [phone]  CC: Referring Provider

## 2019-06-05 NOTE — Telephone Encounter (Signed)
[employer] medicare order sent to [facility]. They will obtain the auth and reach out to the patient to schedule.  °

## 2019-06-06 LAB — RPR: RPR Ser Ql: NONREACTIVE

## 2019-06-06 LAB — FOLATE: Folate: >20 ng/mL (ref >3.0)

## 2019-06-06 LAB — VITAMIN B12: Vitamin B-12: 538 pg/mL (ref 232–1245)

## 2019-06-08 ENCOUNTER — Telehealth: Payer: Self-pay

## 2019-06-08 NOTE — Telephone Encounter (Signed)

## 2019-06-12 ENCOUNTER — Other Ambulatory Visit: Payer: Medicare HMO

## 2019-06-27 ENCOUNTER — Telehealth: Payer: Self-pay

## 2019-06-27 NOTE — Telephone Encounter (Signed)
RB-This is a new pt for Dr. Lynann Bologna last Prolia inj was 5.2019 and Dr. Deborra Medina wants her to restart these/plz advise/thx dmf

## 2019-07-14 ENCOUNTER — Ambulatory Visit: Payer: Self-pay | Admitting: *Deleted

## 2019-07-14 NOTE — Telephone Encounter (Signed)
Patient calling stating that she feels lethargic and that her BP was low earlier today. Pt unable to recall actual BP reading but states her SBP was below 120s. Pt states her BP usually runs 120s/80s. BP checked will on the phone with triage nurse and was noted to be 159/61 and pulse-64. Pt states that she was experiencing heaviness in her chest that radiated down to her left arm.Pt states that taking a deep breath sometimes makes the heaviness in her chest worse.  Pt states she was also having a little SOB with resting and doing activity. Pt states she is not on any medications for BP at this time. Pt advised due to symptoms of heaviness in chest along with SOB she would need to seek treatment in the ED. Pt verbalized understanding and plans to go to the [facility] ED due to it being in close proximity to her home.   Reason for Disposition . Pain also in shoulder(s) or arm(s) or jaw  Answer Assessment - Initial Assessment Questions 1. LOCATION: "Where does it hurt?"       Middle of chest feels heavy  2. RADIATION: "Does the pain go anywhere else?" (e.g., into neck, jaw, arms, back)     Down  3. ONSET: "When did the chest pain begin?" (Minutes, hours or days)      Maybe about 3-4 days ago 4. PATTERN "Does the pain come and go, or has it been constant since it started?"  "Does it get worse with exertion?"      Lasting all day and gets heavier with taking deep breath 5. DURATION: "How long does it last" (e.g., seconds, minutes, hours)     Heaviness  6. SEVERITY: "How bad is the pain?"  (e.g., Scale 1-10; mild, moderate, or severe)    - MILD (1-3): doesn't interfere with normal activities     - MODERATE (4-7): interferes with normal activities or awakens from sleep    - SEVERE (8-10): excruciating pain, unable to do any normal activities       No pain 7. CARDIAC RISK FACTORS: "Do you have any history of heart problems or risk factors for heart disease?" (e.g., angina, prior heart attack; diabetes,  high blood pressure, high cholesterol, smoker, or strong family history of heart disease)     Other sisters did experience heart related issue, last surviving sibling out of 5 8. PULMONARY RISK FACTORS: "Do you have any history of lung disease?"  (e.g., blood clots in lung, asthma, emphysema, birth control pills)     No 9. CAUSE: "What do you think is causing the chest pain?"     unknown 10. OTHER SYMPTOMS: "Do you have any other symptoms?" (e.g., dizziness, nausea, vomiting, sweating, fever, difficulty breathing, cough)       SOB with resting and activity 11. PREGNANCY: "Is there any chance you are pregnant?" "When was your last menstrual period?"       n/a  Answer Assessment - Initial Assessment Questions 1. DESCRIPTION: "Describe how you are feeling."     Feels lethargic 2. SEVERITY: "How bad is it?"  "Can you stand and walk?"   - MILD - Feels weak or tired, but does not interfere with work, school or normal activities   - D[place]key to stand and walk; weakness interferes with work, school, or normal activities   - SEVERE - Unable to stand or walk     mild 3. ONSET:  "When did the weakness begin?"  3-4 days ago 4. CAUSE: "What do you think is causing the weakness?"     unknown 5. MEDICINES: "Have you recently started a new medicine or had a change in the amount of a medicine?"     No 6. OTHER SYMPTOMS: "Do you have any other symptoms?" (e.g., chest pain, fever, cough, SOB, vomiting, diarrhea, bleeding, other areas of pain)     Chest feels heavy, more the center of chest, radiates down left arm 7. PREGNANCY: "Is there any chance you are pregnant?" "When was your last menstrual period?"     n/a  Protocols used: CHEST PAIN-A-AH, WEAKNESS (GENERALIZED) AND FATIGUE-A-AH

## 2019-07-19 ENCOUNTER — Telehealth: Payer: Self-pay | Admitting: Hematology & Oncology

## 2019-07-19 NOTE — Telephone Encounter (Signed)
Returned call to patient confirming she would like to cancel her appointments for 8/14.  She did not wish to reschedule at this time.

## 2019-07-21 ENCOUNTER — Other Ambulatory Visit: Payer: Medicare HMO

## 2019-07-21 ENCOUNTER — Ambulatory Visit: Payer: Medicare HMO | Admitting: Hematology & Oncology

## 2019-07-27 NOTE — Telephone Encounter (Signed)
Pt never sought ED as claimed she would/thx dmf

## 2019-07-28 ENCOUNTER — Telehealth: Payer: Self-pay

## 2019-07-28 NOTE — Telephone Encounter (Signed)
Copied from [place] [id number]. Topic: Referral - Request for Referral >> Jul 28, 2019  2:05 PM Scherrie Gerlach wrote: Pt states her insurance will allow her to have a home health aid to help out around the, chores, take to the dr, but she needs a prescription/order from the dr to give to insurance company. Would like Dr Deborra Medina to do this

## 2019-07-31 NOTE — Telephone Encounter (Signed)
Dr. Aron - please advise 

## 2019-07-31 NOTE — Telephone Encounter (Signed)
Pt is going to call neurology and see if they will help her out with referral so that pt can get qualified for home health.

## 2019-07-31 NOTE — Telephone Encounter (Signed)
Home health usually requires a medical diagnosis with this ( ie a reason why you are homebound).  I think the only thing that perhaps could get her qualified would be to ask her neurologist.

## 2019-09-03 NOTE — Assessment & Plan Note (Addendum)
>  40 minutes spent in face to face time with patient, >50% spent in counselling or coordination of care discussing memory issues, AAT, hemochromatosis, HTN, hypothyroidism and HLD. Check ammonia level and hep C given h/o AAT and elevated liver function. ?lactulose may be helpful if it is elevated.  She will keep appointment with her neurologist for tomorrow.    I will check additional labs today. The patient indicates understanding of these issues and agrees with the plan. Orders Placed This Encounter  Procedures  . Ammonia  . Hepatitis C Antibody  . Comprehensive metabolic panel  . B12 and Folate Panel  . CBC with Differential/Platelet  . Ferritin  . Hemoglobin A1c  . TSH  . T4, free  . Lactate Dehydrogenase (LDH)  . Urinalysis, Routine w reflex microscopic

## 2019-09-03 NOTE — Progress Notes (Signed)
Subjective:   Patient ID: Veronica Meza, female    DOB: 1946/02/12, 73 y.o.   MRN: [id number]  Veronica Meza is a pleasant 73 y.o. year old female who presents to clinic today with Memory Loss (Pt is here today with her daughter accompanying her. She is concerned about ammonia levels and dementia.)  on 09/04/2019  HPI: Patient established care with me on 05/22/19. Chart extensively reviewed prior to today's appointment as she has a complicated medical history, incluidng alpha 1 antitripsyn deficiency, hereditary hemochromatosis, HLD, HTN, hypothyroidism, who presents for a follow up with her daughter.  Hemochromatosis- sees Dr. Myna Hidalgo- current therapy is Phlebotomy to maintain ferritin below 100. Lab Results  Component Value Date   FERRITIN 87 05/22/2019   Lab Results  Component Value Date   WBC 3.8 05/22/2019   HGB 14.2 05/22/2019   HCT 40.3 05/22/2019   MCV 89.2 05/22/2019   PLT 188 05/22/2019   HLD- On lipitor 40 mg daily. No results found for: CHOL, HDL, LDLCALC, LDLDIRECT, TRIG, CHOLHDL  The 10-year ASCVD risk score Denman George DC Jr., et al., 2013) is: 24.2%   Values used to calculate the score:     Age: 26 years     Sex: Female     Is Non-Hispanic African American: No     Diabetic: No     Tobacco smoker: No     Systolic Blood Pressure: 148 mmHg     Is BP treated: Yes     HDL Cholesterol: 46 MG/DL     Total Cholesterol: 165 MG/DL  Family is worried about her ammonia levels due to her memory. She does see a neurologist, Dr. Terrace Arabia.  Was last seen on 06/05/19.  Has appointment to see her again tomorrow.  Assessment and plan at that time was:  Memory loss             Strong family history of dementia, also family history of alpha 1 antitrypsin deficiency             Most likely central nervous system degenerative disorder             Mini-Mental Status Examination 24 out of 30 today             MRI of brain             Laboratory evaluations             Return to clinic  in 3 to 4 months  Ferritin, CBC,  B12, RPR, Vit D and folate wnl.  MRI done at [facility]- per pt's daughter, it was normal.  Daughter and pt feel her memory has worsened over the past 3 months.  She has gotten lost in the parking lot, took money out for her grand daughter's birthday 3 times and didn't realize she had done that.  Pt admits she has had friends call and ask why she didn't show up to a place when she told them she would- she had no recollection of promising to meet anyone.  Lab Results  Component Value Date   VD25OH 51 05/22/2019   HTN- currently taking lisinopril 40 mg daily and HCTZ 25 mg daily.  BP Readings from Last 3 Encounters:  09/04/19 (!) 148/74  06/05/19 123/67  05/22/19 (!) 148/64   Lab Results  Component Value Date   CREATININE 0.78 04/21/2019   No results found for: HGBA1C    Hypothyroidism- on synthroid 75 mcg daily. Lab Results  Component Value  Date   TSH 1.74 04/11/2019   Current Outpatient Medications on File Prior to Visit  Medication Sig Dispense Refill  . atorvastatin (LIPITOR) 40 MG tablet Take 40 mg by mouth daily.    . busPIRone (BUSPAR) 10 MG tablet Take 10 mg by mouth 2 (two) times daily as needed.     . Cholecalciferol (VITAMIN D3) 125 MCG (5000 UT) TABS Take by mouth.    . gabapentin (NEURONTIN) 300 MG capsule Take 300 mg by mouth 3 (three) times daily.    . hydrochlorothiazide (HYDRODIURIL) 25 MG tablet Take 25 mg by mouth daily.    Marland Kitchen levothyroxine (SYNTHROID) 75 MCG tablet Take 75 mcg by mouth daily.    Marland Kitchen lisinopril (ZESTRIL) 40 MG tablet Take 40 mg by mouth daily.    Marland Kitchen loratadine (CLARITIN) 10 MG tablet Take 10 mg by mouth.    . Mag Aspart-Potassium Aspart (POTASSIUM & MAGNESIUM ASPARTAT PO) Take 1 tablet by mouth daily.     No current facility-administered medications on file prior to visit.     Allergies  Allergen Reactions  . Alendronate Nausea Only  . Codeine Other (See Comments)    REACTION: Generalized swelling  .  Hydrocodone-Acetaminophen Swelling    REACTION: Generalized swelling   . Erythromycin Base Nausea Only  . Sulfa Antibiotics Itching and Nausea And Vomiting    Past Medical History:  Diagnosis Date  . AAT (alpha-1-antitrypsin) deficiency ([place]) 01/13/2017  . Hypertension   . Memory loss     Past Surgical History:  Procedure Laterality Date  . APPENDECTOMY    . ELBOW SURGERY Right   . PORT WINE STAIN REMOVAL W/ LASER      Family History  Problem Relation Age of Onset  . Stroke Mother   . Heart disease Father     Social History   Socioeconomic History  . Marital status: Married    Spouse name: Not on file  . Number of children: 3  . Years of education: some college  . Highest education level: Not on file  Occupational History  . Occupation: retired  Scientific laboratory technician  . Financial resource strain: Not on file  . Food insecurity    Worry: Not on file    Inability: Not on file  . Transportation needs    Medical: Not on file    Non-medical: Not on file  Tobacco Use  . Smoking status: Former Smoker    Packs/day: 1.00    Years: 15.00    Pack years: 15.00    Types: Cigarettes    Quit date: 01/13/2010    Years since quitting: 9.6  . Smokeless tobacco: Never Used  Substance and Sexual Activity  . Alcohol use: No  . Drug use: No  . Sexual activity: Not on file  Lifestyle  . Physical activity    Days per week: Not on file    Minutes per session: Not on file  . Stress: Not on file  Relationships  . Social Herbalist on phone: Not on file    Gets together: Not on file    Attends religious service: Not on file    Active member of club or organization: Not on file    Attends meetings of clubs or organizations: Not on file    Relationship status: Not on file  . Intimate partner violence    Fear of current or ex partner: Not on file    Emotionally abused: Not on file    Physically abused:  Not on file    Forced sexual activity: Not on file  Other Topics Concern   . Not on file  Social History Narrative   Lives at home with husband and grandson.   Right-handed.   Occasional caffeine use.   The PMH, PSH, Social History, Family History, Medications, and allergies have been reviewed in [facility], and have been updated if relevant.   Review of Systems  Constitutional: Negative.   HENT: Negative.   Eyes: Negative.   Respiratory: Negative.   Cardiovascular: Negative.   Gastrointestinal: Negative.   Endocrine: Negative.   Genitourinary: Negative.   Musculoskeletal: Negative.   Skin: Negative.   Allergic/Immunologic: Negative.   Neurological: Negative.  Negative for speech difficulty.  Hematological: Negative.   Psychiatric/Behavioral: Positive for confusion.  All other systems reviewed and are negative.      Objective:    BP (!) 148/74 (BP Location: Left Arm, Cuff Size: Normal)   Pulse 65   Temp 98.6 F (37 C) (Oral)   Wt 119 lb 12.8 oz (54.3 kg)   SpO2 95%   BMI 21.91 kg/m   Wt Readings from Last 3 Encounters:  09/04/19 119 lb 12.8 oz (54.3 kg)  06/05/19 117 lb (53.1 kg)  05/22/19 118 lb 6.4 oz (53.7 kg)    Physical Exam Vitals signs and nursing note reviewed.  Constitutional:      General: She is not in acute distress.    Appearance: Normal appearance. She is not ill-appearing.  HENT:     Head: Normocephalic.     Right Ear: External ear normal.     Left Ear: External ear normal.     Nose: Nose normal.     Mouth/Throat:     Mouth: Mucous membranes are moist.  Eyes:     Extraocular Movements: Extraocular movements intact.  Neck:     Musculoskeletal: Normal range of motion.  Cardiovascular:     Rate and Rhythm: Normal rate and regular rhythm.     Pulses: Normal pulses.     Heart sounds: Normal heart sounds.  Pulmonary:     Effort: Pulmonary effort is normal.     Breath sounds: Normal breath sounds.  Abdominal:     General: Abdomen is flat. Bowel sounds are normal.     Palpations: Abdomen is soft.  Musculoskeletal:  Normal range of motion.  Skin:    General: Skin is warm and dry.     Coloration: Skin is not jaundiced.  Neurological:     General: No focal deficit present.     Mental Status: She is alert.  Psychiatric:        Mood and Affect: Mood normal.        Behavior: Behavior normal.        Thought Content: Thought content normal.        Judgment: Judgment normal.           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

## 2019-09-03 NOTE — Assessment & Plan Note (Addendum)
On lipitor 40 mg daily. Due for lipid panel. Will check that today.

## 2019-09-03 NOTE — Assessment & Plan Note (Signed)
Continue current dose of synthroid. Due for labs today. Orders Placed This Encounter  Procedures  . Ammonia  . Hepatitis C Antibody  . Comprehensive metabolic panel  . B12 and Folate Panel  . CBC with Differential/Platelet  . Ferritin

## 2019-09-04 ENCOUNTER — Other Ambulatory Visit: Payer: Self-pay

## 2019-09-04 ENCOUNTER — Encounter: Payer: Self-pay | Admitting: Family Medicine

## 2019-09-04 ENCOUNTER — Ambulatory Visit (INDEPENDENT_AMBULATORY_CARE_PROVIDER_SITE_OTHER): Payer: Medicare HMO | Admitting: Family Medicine

## 2019-09-04 VITALS — BP 148/74 | HR 65 | Temp 98.6°F | Wt 119.8 lb

## 2019-09-04 DIAGNOSIS — E8801 Alpha-1-antitrypsin deficiency: Secondary | ICD-10-CM | POA: Diagnosis not present

## 2019-09-04 DIAGNOSIS — Z1159 Encounter for screening for other viral diseases: Secondary | ICD-10-CM

## 2019-09-04 DIAGNOSIS — E559 Vitamin D deficiency, unspecified: Secondary | ICD-10-CM

## 2019-09-04 DIAGNOSIS — I10 Essential (primary) hypertension: Secondary | ICD-10-CM | POA: Diagnosis not present

## 2019-09-04 DIAGNOSIS — R413 Other amnesia: Secondary | ICD-10-CM

## 2019-09-04 DIAGNOSIS — E782 Mixed hyperlipidemia: Secondary | ICD-10-CM

## 2019-09-04 DIAGNOSIS — E038 Other specified hypothyroidism: Secondary | ICD-10-CM | POA: Diagnosis not present

## 2019-09-04 DIAGNOSIS — G300 Alzheimer's disease with early onset: Secondary | ICD-10-CM

## 2019-09-04 DIAGNOSIS — F0280 Dementia in other diseases classified elsewhere without behavioral disturbance: Secondary | ICD-10-CM

## 2019-09-04 LAB — CBC WITH DIFFERENTIAL/PLATELET
Basophils Absolute: 0 10*3/uL (ref 0.0–0.1)
Basophils Relative: 0.6 % (ref 0.0–3.0)
Eosinophils Absolute: 0.2 10*3/uL (ref 0.0–0.7)
Eosinophils Relative: 2.7 % (ref 0.0–5.0)
HCT: 41.1 % (ref 36.0–46.0)
Hemoglobin: 13.8 g/dL (ref 12.0–15.0)
Lymphocytes Relative: 34.9 % (ref 12.0–46.0)
Lymphs Abs: 2.4 10*3/uL (ref 0.7–4.0)
MCHC: 33.5 g/dL (ref 30.0–36.0)
MCV: 96.9 fl (ref 78.0–100.0)
Monocytes Absolute: 0.5 10*3/uL (ref 0.1–1.0)
Monocytes Relative: 7.5 % (ref 3.0–12.0)
Neutro Abs: 3.7 10*3/uL (ref 1.4–7.7)
Neutrophils Relative %: 54.3 % (ref 43.0–77.0)
Platelets: 168 10*3/uL (ref 150.0–400.0)
RBC: 4.24 Mil/uL (ref 3.87–5.11)
RDW: 13.7 % (ref 11.5–15.5)
WBC: 6.7 10*3/uL (ref 4.0–10.5)

## 2019-09-04 LAB — TSH: TSH: 1.91 u[IU]/mL (ref 0.35–4.50)

## 2019-09-04 LAB — COMPREHENSIVE METABOLIC PANEL
ALT: 50 U/L — ABNORMAL HIGH (ref 0–35)
AST: 68 U/L — ABNORMAL HIGH (ref 0–37)
Albumin: 4.4 g/dL (ref 3.5–5.2)
Alkaline Phosphatase: 70 U/L (ref 39–117)
BUN: 14 mg/dL (ref 6–23)
CO2: 27 mEq/L (ref 19–32)
Calcium: 10.1 mg/dL (ref 8.4–10.5)
Chloride: 101 mEq/L (ref 96–112)
Creatinine, Ser: 0.85 mg/dL (ref 0.40–1.20)
GFR: 65.24 mL/min (ref >60.00)
Glucose, Bld: 104 mg/dL — ABNORMAL HIGH (ref 70–99)
Potassium: 3.8 mEq/L (ref 3.5–5.1)
Sodium: 138 mEq/L (ref 135–145)
Total Bilirubin: 0.5 mg/dL (ref 0.2–1.2)
Total Protein: 7.7 g/dL (ref 6.0–8.3)

## 2019-09-04 LAB — FERRITIN: Ferritin: 81.9 ng/mL (ref 10.0–291.0)

## 2019-09-04 LAB — AMMONIA: Ammonia: 60 umol/L — ABNORMAL HIGH (ref 11–35)

## 2019-09-04 LAB — B12 AND FOLATE PANEL
Folate: >24.1 ng/mL (ref >5.9)
Vitamin B-12: 444 pg/mL (ref 211–911)

## 2019-09-04 LAB — T4, FREE: Free T4: 0.84 ng/dL (ref 0.60–1.60)

## 2019-09-04 NOTE — Assessment & Plan Note (Signed)
Mildly elevated today but son does help give her medications and states she is taking them. She is asymptomatic.

## 2019-09-04 NOTE — Patient Instructions (Signed)
Great to see you. I will call you with your lab results from today and you can view them online.   You have an appointment with your neurologist tomorrow at 1 pm.

## 2019-09-04 NOTE — Assessment & Plan Note (Signed)
Check ferritin and CBC today 

## 2019-09-05 ENCOUNTER — Encounter: Payer: Self-pay | Admitting: Neurology

## 2019-09-05 ENCOUNTER — Ambulatory Visit: Payer: Medicare HMO | Admitting: Neurology

## 2019-09-05 VITALS — BP 141/61 | HR 62 | Temp 96.2°F | Ht 62.0 in | Wt 119.0 lb

## 2019-09-05 DIAGNOSIS — R413 Other amnesia: Secondary | ICD-10-CM

## 2019-09-05 DIAGNOSIS — E8801 Alpha-1-antitrypsin deficiency: Secondary | ICD-10-CM | POA: Diagnosis not present

## 2019-09-05 LAB — URINALYSIS, ROUTINE W REFLEX MICROSCOPIC
Bilirubin Urine: NEGATIVE
Hgb urine dipstick: NEGATIVE
Ketones, ur: NEGATIVE
Leukocytes,Ua: NEGATIVE
Nitrite: NEGATIVE
RBC / HPF: NONE SEEN (ref 0–?)
Specific Gravity, Urine: 1.01 (ref 1.000–1.030)
Total Protein, Urine: NEGATIVE
Urine Glucose: NEGATIVE
Urobilinogen, UA: 0.2 (ref 0.0–1.0)
pH: 7 (ref 5.0–8.0)

## 2019-09-05 LAB — HEPATITIS C ANTIBODY
Hepatitis C Ab: NONREACTIVE
SIGNAL TO CUT-OFF: 0.01 (ref <1.00)

## 2019-09-05 LAB — HEMOGLOBIN A1C: Hgb A1c MFr Bld: 6.1 % (ref 4.6–6.5)

## 2019-09-05 LAB — LACTATE DEHYDROGENASE: LDH: 117 U/L — ABNORMAL LOW (ref 120–250)

## 2019-09-05 MED ORDER — MEMANTINE HCL 10 MG PO TABS: 10.0000 mg | ORAL_TABLET | Freq: Two times a day (BID) | ORAL | 11 refills | Status: DC

## 2019-09-05 MED ORDER — DONEPEZIL HCL 10 MG PO TABS: 10.0000 mg | ORAL_TABLET | Freq: Every day | ORAL | 11 refills | Status: DC

## 2019-09-05 NOTE — Progress Notes (Addendum)
PATIENT: Veronica Meza DOB: [birth year]  Chief Complaint  Patient presents with  . Memory Loss    She is here with her husband, Broadus John.  She continues to experience the same memory loss. Her recent MMSE score was 24/30 (completed on 06/05/2019).  She declined the repeat brain MRI but is willing to have it now, if needed.  Her last scan was at [facility] on 10/31/2018.     HISTORICAL  Veronica Meza is 73 year old female, seen in request by her primary care physician Dr. Deborra Medina, [place] for evaluation of memory loss, she is accompanied by her husband at today's clinic visit.  I have reviewed and summarized the referring note from the referring physician.  She had past medical history of hypertension, hyperlipidemia, hypothyroidism, on supplement, also has alpha 1 antitrypsin deficiency presented with abnormal liver function, family history of similar disease, her other 4 sisters died with memory loss, in their 22s and 61s.  She is retired Banker, was noted to have gradual onset memory loss since 2015, gradually getting worse, now she lives at home with her husband, tends to repeat herself, still driving, but got lost while driving,  She also complains of fatigue, not sleeping well, repeating conversations, difficulty following up with conversations, her husband is in wheelchair, suffered multiple sclerosis for more than 20 years.  Laboratory evaluations showed normal ferritin, TIBC of 384, CBC, CMP with AST of 45  UPDATE Sept 29 2020: She complains of feeling fatigued all the time, sleeps a lot, appetite is not good, continue to be active at home, but tends to repeat questions, taking a lot of notes.  MRI of the brain with without contrast [facility] in November 2019: normal MRI of the brain Laboratory evaluations in September 2020, LDH was 117, ammonia of 60 normal thyroid functional test, A1c of 6.1, ammonia level of 60  REVIEW OF SYSTEMS: Full 14 system  review of systems performed and notable only for as above All other review of systems were negative.  ALLERGIES: Allergies  Allergen Reactions  . Alendronate Nausea Only  . Codeine Other (See Comments)    REACTION: Generalized swelling  . Hydrocodone-Acetaminophen Swelling    REACTION: Generalized swelling   . Erythromycin Base Nausea Only  . Sulfa Antibiotics Itching and Nausea And Vomiting    HOME MEDICATIONS: Current Outpatient Medications  Medication Sig Dispense Refill  . atorvastatin (LIPITOR) 40 MG tablet Take 40 mg by mouth daily.    . busPIRone (BUSPAR) 10 MG tablet Take 10 mg by mouth 2 (two) times daily as needed.     . Cholecalciferol (VITAMIN D3) 125 MCG (5000 UT) TABS Take by mouth.    . gabapentin (NEURONTIN) 300 MG capsule Take 300 mg by mouth 3 (three) times daily.    . hydrochlorothiazide (HYDRODIURIL) 25 MG tablet Take 25 mg by mouth daily.    Marland Kitchen levothyroxine (SYNTHROID) 75 MCG tablet Take 75 mcg by mouth daily.    Marland Kitchen lisinopril (ZESTRIL) 40 MG tablet Take 40 mg by mouth daily.    Marland Kitchen loratadine (CLARITIN) 10 MG tablet Take 10 mg by mouth.    . Mag Aspart-Potassium Aspart (POTASSIUM & MAGNESIUM ASPARTAT PO) Take 1 tablet by mouth daily.     No current facility-administered medications for this visit.     PAST MEDICAL HISTORY: Past Medical History:  Diagnosis Date  . AAT (alpha-1-antitrypsin) deficiency ([place]) 01/13/2017  . Hypertension   . Memory loss  PAST SURGICAL HISTORY: Past Surgical History:  Procedure Laterality Date  . APPENDECTOMY    . ELBOW SURGERY Right   . PORT WINE STAIN REMOVAL W/ LASER      FAMILY HISTORY: Family History  Problem Relation Age of Onset  . Stroke Mother   . Heart disease Father     SOCIAL HISTORY: Social History   Socioeconomic History  . Marital status: Married    Spouse name: Not on file  . Number of children: 3  . Years of education: some college  . Highest education level: Not on file  Occupational  History  . Occupation: retired  Engineer, production  . Financial resource strain: Not on file  . Food insecurity    Worry: Not on file    Inability: Not on file  . Transportation needs    Medical: Not on file    Non-medical: Not on file  Tobacco Use  . Smoking status: Former Smoker    Packs/day: 1.00    Years: 15.00    Pack years: 15.00    Types: Cigarettes    Quit date: 01/13/2010    Years since quitting: 9.6  . Smokeless tobacco: Never Used  Substance and Sexual Activity  . Alcohol use: No  . Drug use: No  . Sexual activity: Not on file  Lifestyle  . Physical activity    Days per week: Not on file    Minutes per session: Not on file  . Stress: Not on file  Relationships  . Social Musician on phone: Not on file    Gets together: Not on file    Attends religious service: Not on file    Active member of club or organization: Not on file    Attends meetings of clubs or organizations: Not on file    Relationship status: Not on file  . Intimate partner violence    Fear of current or ex partner: Not on file    Emotionally abused: Not on file    Physically abused: Not on file    Forced sexual activity: Not on file  Other Topics Concern  . Not on file  Social History Narrative   Lives at home with husband and grandson.   Right-handed.   Occasional caffeine use.     PHYSICAL EXAM   Vitals:   09/05/19 1312  BP: (!) 141/61  Pulse: 62  Temp: (!) 96.2 F (35.7 C)  Weight: 119 lb (54 kg)  Height: 5\' 2"  (1.575 m)    Not recorded      Body mass index is 21.77 kg/m.  PHYSICAL EXAMNIATION:  Gen: NAD, conversant, well nourised,  well groomed                     Cardiovascular: Regular rate rhythm, no peripheral edema, warm, nontender. Eyes: Conjunctivae clear without exudates or hemorrhage Neck: Supple, no carotid bruits. Pulmonary: Clear to auscultation bilaterally   NEUROLOGICAL EXAM: MMSE - Mini Mental State Exam 06/05/2019  Orientation to time 2   Orientation to Place 5  Registration 3  Attention/ Calculation 3  Recall 2  Language- name 2 objects 2  Language- repeat 1  Language- follow 3 step command 3  Language- read & follow direction 1  Write a sentence 1  Copy design 1  Total score 24     CRANIAL NERVES: CN II: Visual Eno are full to confrontation.  Pupils are round equal and briskly reactive to light. CN III,  IV, VI: extraocular movement are normal. No ptosis. CN V: Facial sensation is intact to pinprick in all 3 divisions bilaterally. Corneal responses are intact.  CN VII: Face is symmetric with normal eye closure and smile. CN VIII: Hearing is normal to casual conversation CN IX, X: Palate elevates symmetrically. Phonation is normal. CN XI: Head turning and shoulder shrug are intact CN XII: Tongue is midline with normal movements and no atrophy.  MOTOR:  Muscle bulk and tone are normal. Muscle strength is normal.  REFLEXES: Reflexes are 2+ and symmetric at the biceps, triceps, knees, and ankles. Plantar responses are flexor.  SENSORY: Intact to light touch, pinprick, positional sensation and vibratory sensation are intact in fingers and toes.  COORDINATION: Rapid alternating movements and fine finger movements are intact. There is no dysmetria on finger-to-nose and heel-knee-shin.    GAIT/STANCE: Posture is normal. Gait is steady with normal steps, base, arm swing, and turning. Heel and toe walking are normal. Tandem gait is normal.  Romberg is absent.   DIAGNOSTIC DATA (LABS, IMAGING, TESTING) - I reviewed patient records, labs, notes, testing and imaging myself where available.   ASSESSMENT AND PLAN  Cyril LoosenSusan H Laredo is a 73 y.o. female   Memory loss  Strong family history of dementia, also family history of alpha 1 antitrypsin deficiency  Most likely central nervous system degenerative disorder  Mini-Mental Status Examination 24 out of 30 today  MRI of brain showed no structural lesion   Laboratory evaluations showed no treatable etiology, normal B12, TSH,  Start Namenda 10 mg twice a day, Aricept 10 mg daily   Levert FeinsteinYijun Klyde Banka, M.D. Ph.D.  [facility] [address], [address] [place], [place] [address] Ph: [phone] Fax: [phone]  CC: Referring Provider

## 2019-09-07 ENCOUNTER — Ambulatory Visit: Payer: Medicare HMO | Admitting: Family Medicine

## 2019-09-07 ENCOUNTER — Other Ambulatory Visit: Payer: Self-pay

## 2019-09-07 ENCOUNTER — Other Ambulatory Visit: Payer: Self-pay | Admitting: Family Medicine

## 2019-10-09 ENCOUNTER — Other Ambulatory Visit: Payer: Self-pay

## 2019-10-09 ENCOUNTER — Other Ambulatory Visit: Payer: Self-pay | Admitting: Family Medicine

## 2019-10-09 ENCOUNTER — Ambulatory Visit: Payer: Medicare HMO | Admitting: Family Medicine

## 2019-10-09 ENCOUNTER — Other Ambulatory Visit (INDEPENDENT_AMBULATORY_CARE_PROVIDER_SITE_OTHER): Payer: Medicare HMO

## 2019-10-09 DIAGNOSIS — R748 Abnormal levels of other serum enzymes: Secondary | ICD-10-CM | POA: Diagnosis not present

## 2019-10-09 DIAGNOSIS — G928 Other toxic encephalopathy: Secondary | ICD-10-CM | POA: Insufficient documentation

## 2019-10-09 DIAGNOSIS — R413 Other amnesia: Secondary | ICD-10-CM

## 2019-10-09 DIAGNOSIS — T59891A Toxic effect of other specified gases, fumes and vapors, accidental (unintentional), initial encounter: Secondary | ICD-10-CM | POA: Insufficient documentation

## 2019-10-09 LAB — COMPREHENSIVE METABOLIC PANEL
ALT: 55 U/L — ABNORMAL HIGH (ref 0–35)
AST: 68 U/L — ABNORMAL HIGH (ref 0–37)
Albumin: 4.2 g/dL (ref 3.5–5.2)
Alkaline Phosphatase: 61 U/L (ref 39–117)
BUN: 14 mg/dL (ref 6–23)
CO2: 28 mEq/L (ref 19–32)
Calcium: 10 mg/dL (ref 8.4–10.5)
Chloride: 102 mEq/L (ref 96–112)
Creatinine, Ser: 0.75 mg/dL (ref 0.40–1.20)
GFR: 75.36 mL/min (ref >60.00)
Glucose, Bld: 96 mg/dL (ref 70–99)
Potassium: 3.9 mEq/L (ref 3.5–5.1)
Sodium: 139 mEq/L (ref 135–145)
Total Bilirubin: 0.5 mg/dL (ref 0.2–1.2)
Total Protein: 7.2 g/dL (ref 6.0–8.3)

## 2019-10-09 LAB — AMMONIA: Ammonia: 48 umol/L — ABNORMAL HIGH (ref 11–35)

## 2019-10-09 LAB — B12 AND FOLATE PANEL
Folate: >24.1 ng/mL (ref >5.9)
Vitamin B-12: 493 pg/mL (ref 211–911)

## 2019-10-12 ENCOUNTER — Telehealth: Payer: Self-pay

## 2019-10-12 ENCOUNTER — Other Ambulatory Visit: Payer: Self-pay

## 2019-10-12 ENCOUNTER — Encounter: Payer: Self-pay | Admitting: Family Medicine

## 2019-10-12 ENCOUNTER — Ambulatory Visit (INDEPENDENT_AMBULATORY_CARE_PROVIDER_SITE_OTHER): Payer: Medicare HMO | Admitting: Family Medicine

## 2019-10-12 VITALS — BP 130/70 | Temp 96.7°F

## 2019-10-12 DIAGNOSIS — R413 Other amnesia: Secondary | ICD-10-CM

## 2019-10-12 DIAGNOSIS — G300 Alzheimer's disease with early onset: Secondary | ICD-10-CM

## 2019-10-12 DIAGNOSIS — F0280 Dementia in other diseases classified elsewhere without behavioral disturbance: Secondary | ICD-10-CM

## 2019-10-12 MED ORDER — HYDROCHLOROTHIAZIDE 25 MG PO TABS: 25.0000 mg | ORAL_TABLET | Freq: Every day | ORAL | 3 refills | Status: DC

## 2019-10-12 MED ORDER — DONEPEZIL HCL 5 MG PO TABS: 5.0000 mg | ORAL_TABLET | Freq: Every day | ORAL | 3 refills | Status: DC

## 2019-10-12 NOTE — Telephone Encounter (Signed)
Copied from [place] [id number]. Topic: Referral - Request for Referral >> Oct 12, 2019 11:55 AM Alanda Slim E wrote: Has patient seen PCP for this complaint? Not sure  *If NO, is insurance requiring patient see PCP for this issue before PCP can refer them? Referral for which specialty: Home health aid  Preferred provider/office:  Reason for referral: Pts husband is Handicap and she also needs assistance from a home health aid / someone to check in on them / Pt would like it for her and Marinda Elk Tait DOB [birth year]

## 2019-10-12 NOTE — Assessment & Plan Note (Signed)
>  25 minutes spent in face to face time with patient, >50% spent in counselling or coordination of care discussing dementia with pt.  I did advise her to call her neurologist (I told her daughter this as well) but she would prefer I manage her dementia.   I am okay with that since neuro did a thorough work up. We agreed to start slow with medications- aricept only- and low dose- eRx sent for aricept 5 mg nightly. She and her family will keep me updated.  The patient indicates understanding of these issues and agrees with the plan.

## 2019-10-12 NOTE — Telephone Encounter (Signed)
Please see message and advise.  Thank you. Did this pt speak to you about this during appointment today?

## 2019-10-12 NOTE — Progress Notes (Signed)
Virtual Visit via Video   Due to the COVID-19 pandemic, this visit was completed with telemedicine (audio/video) technology to reduce patient and provider exposure as well as to preserve personal protective equipment.   I connected with Veronica Meza by a video enabled telemedicine application and verified that I am speaking with the correct person using two identifiers. Location patient: Home Location provider:  [facility], Office Persons participating in the virtual visit: Cyril Loosen, Ruthe Mannan, MD   I discussed the limitations of evaluation and management by telemedicine and the availability of in person appointments. The patient expressed understanding and agreed to proceed.  Care Team   Patient Care Team: Dianne Dun, MD as PCP - General (Family Medicine)  Subjective:   HPI:   Follow up memory loss-  I did refer her to see a neurologist for this, Dr. Terrace Arabia, who did place her on aricept and namenda on 09/05/19.  Here assessment and plan was:  ASSESSMENT AND PLAN  Veronica Meza is a 73 y.o. female   Memory loss             Strong family history of dementia, also family history of alpha 1 antitrypsin deficiency             Most likely central nervous system degenerative disorder             Mini-Mental Status Examination 24 out of 30 today             MRI of brain showed no structural lesion             Laboratory evaluations showed no treatable etiology, normal B12, TSH,             Start Namenda 10 mg twice a day, Aricept 10 mg daily   Levert Feinstein, M.D. Ph.D.  I also checked an ammonia level which has trended down from 60 to 48 this month.    Other labs stable.  She is not taking aricept or namenda.  She was placed on Aricept 10 and namenda 10 mg twice daily.  Husband felt it made her feel "weird," patient remembers having diarrhea.  Only took one or two doses.  Husband feels her memory is about the same.  Review of Systems  Constitutional: Negative.    HENT: Negative.   Respiratory: Negative.   Cardiovascular: Negative.   Gastrointestinal: Negative.   Genitourinary: Negative.   Musculoskeletal: Negative.   Skin: Negative.   Neurological: Negative.   Endo/Heme/Allergies: Negative.   Psychiatric/Behavioral: Positive for memory loss.  All other systems reviewed and are negative.    Patient Active Problem List   Diagnosis Date Noted  . 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 Memory loss 06/05/2019  . Osteoporosis 05/21/2019  . Vitamin D deficiency 05/21/2019  . 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 HTN (hypertension) 04/11/2019  . HLD (hyperlipidemia) 04/11/2019  . Orthostatic dizziness 04/11/2019  . Hypothyroidism 04/11/2019  . Weakness 04/11/2019  . Hereditary hemochromatosis (HCC) 02/05/2017  . AAT (alpha-1-antitrypsin) deficiency (HCC) 01/13/2017    Social History   Tobacco Use  . Smoking status: Former Smoker    Packs/day: 1.00    Years: 15.00    Pack years: 15.00    Types: Cigarettes    Quit date: 01/13/2010    Years since quitting: 9.7  . Smokeless tobacco: Never Used  Substance Use Topics  . Alcohol use: No    Current Outpatient Medications:  .  atorvastatin (LIPITOR) 40 MG tablet, Take 40 mg by mouth daily., Disp: , Rfl:  .  busPIRone (BUSPAR) 10 MG tablet, Take 10 mg by mouth 2 (two) times daily as needed. , Disp: , Rfl:  .  Cholecalciferol (VITAMIN D3) 125 MCG (5000 UT) TABS, Take by mouth., Disp: , Rfl:  .  gabapentin (NEURONTIN) 300 MG capsule, Take 300 mg by mouth 3 (three) times daily., Disp: , Rfl:  .  hydrochlorothiazide (HYDRODIURIL) 25 MG tablet, Take 25 mg by mouth daily., Disp: , Rfl:  .  levothyroxine (SYNTHROID) 75 MCG tablet, Take 75 mcg by mouth daily., Disp: , Rfl:  .  lisinopril (ZESTRIL) 40 MG tablet, Take 40 mg by mouth daily., Disp: , Rfl:  .  loratadine (CLARITIN) 10 MG tablet, Take 10 mg by mouth., Disp: , Rfl:  .  Mag Aspart-Potassium  Aspart (POTASSIUM & MAGNESIUM ASPARTAT PO), Take 1 tablet by mouth daily., Disp: , Rfl:   Allergies  Allergen Reactions  . Alendronate Nausea Only  . Codeine Other (See Comments)    REACTION: Generalized swelling  . Hydrocodone-Acetaminophen Swelling    REACTION: Generalized swelling   . Erythromycin Base Nausea Only  . Sulfa Antibiotics Itching and Nausea And Vomiting    Objective:   VITALS: Per patient if applicable, see vitals. GENERAL: Alert, appears well and in no acute distress. HEENT: Atraumatic, conjunctiva clear, no obvious abnormalities on inspection of external nose and ears. NECK: Normal movements of the head and neck. CARDIOPULMONARY: No increased WOB. Speaking in clear sentences. I:E ratio WNL.  MS: Moves all visible extremities without noticeable abnormality. PSYCH: Pleasant and cooperative, well-groomed. Speech normal rate and rhythm. Affect is appropriate. Insight and judgement are appropriate. Attention is focused, linear, and appropriate.  NEURO: CN grossly intact. Oriented as arrived to appointment on time with no prompting. Moves both UE equally.  SKIN: No obvious lesions, wounds, erythema, or cyanosis noted on face or hands.  Depression screen PHQ 2/9 10/12/2019  Decreased Interest 0  Down, Depressed, Hopeless 0  PHQ - 2 Score 0    Assessment and Plan:   There are no diagnoses linked to this encounter.  Marland Kitchen COVID-19 Education: The signs and symptoms of COVID-19 were discussed with the patient and how to seek care for testing if needed. The importance of social distancing was discussed today. . Reviewed expectations re: course of current medical issues. . Discussed self-management of symptoms. . Outlined signs and symptoms indicating need for more acute intervention. . Patient verbalized understanding and all questions were answered. Marland Kitchen Health Maintenance issues including appropriate healthy diet, exercise, and smoking avoidance were discussed with  patient. . See orders for this visit as documented in the electronic medical record.  Arnette Norris, MD  Records requested if needed. Time spent: 25 minutes, of which >50% was spent in obtaining information about her symptoms, reviewing her previous labs, evaluations, and treatments, counseling her about her condition (please see the discussed topics above), and developing a plan to further investigate it; she had a number of questions which I addressed.

## 2019-10-12 NOTE — Telephone Encounter (Signed)
I doubt she will qualify for home health as she is not home bound.  They are quite strict about that. Unfortunately, she would likely need to pay for a private aid to come in the house.

## 2019-10-16 NOTE — Telephone Encounter (Signed)
I spoke with Veronica Meza and informed her of Dr. Hulen Shouts message about the home health aide.  Veronica Meza wanted to thank Dr. Deborra Medina for giving her that information.

## 2019-11-14 NOTE — Progress Notes (Signed)
Virtual Visit via Video Note  I connected with patient on 11/15/19 at  8:45 AM EST by audio enabled telemedicine application and verified that I am speaking with the correct person using two identifiers.   THIS ENCOUNTER IS A VIRTUAL VISIT DUE TO COVID-19 - PATIENT WAS NOT SEEN IN THE OFFICE. PATIENT HAS CONSENTED TO VIRTUAL VISIT / TELEMEDICINE VISIT   Location of patient: home  Location of provider: office  I discussed the limitations of evaluation and management by telemedicine and the availability of in person appointments. The patient expressed understanding and agreed to proceed.   Subjective:   Veronica Meza is a 73 y.o. female who presents for an Initial Medicare Annual Wellness Visit.  Review of Systems  Home Safety/Smoke Alarms: Feels safe in home. Smoke alarms in place.  Lives with husband in 2 story home. Son lives upstairs.No trouble w/ stairs. Walk in shower w/ grab bars.   Female:        Mammo-declines       Dexa scan- pt states she will discuss w/ PCP at a later date       [facility]S-pt states she will discuss w/ PCP at a later date   Objective:     Advanced Directives 11/15/2019 04/21/2019 01/17/2019 10/26/2018 08/03/2018 04/05/2018 02/01/2018  Does Patient Have a Medical Advance Directive? Yes Yes Yes Yes Yes Yes Yes  Type of Estate agent of [place];Living will Healthcare Power of [place];Living will Healthcare Power of [place];Living will Healthcare Power of [place];Living will - - Healthcare Power of [place];Living will  Does patient want to make changes to medical advance directive? No - Patient declined - - - No - Patient declined - -  Copy of Healthcare Power of Attorney in Chart? No - copy requested - No - copy requested No - copy requested - - No - copy requested    Current Medications (verified) Outpatient Encounter Medications as of 11/15/2019  Medication Sig  . atorvastatin (LIPITOR) 40 MG tablet Take 40 mg by mouth daily.  . busPIRone  (BUSPAR) 10 MG tablet Take 10 mg by mouth 2 (two) times daily as needed.   . Cholecalciferol (VITAMIN D3) 125 MCG (5000 UT) TABS Take by mouth.  . donepezil (ARICEPT) 5 MG tablet Take 1 tablet (5 mg total) by mouth at bedtime.  . gabapentin (NEURONTIN) 300 MG capsule Take 300 mg by mouth 3 (three) times daily.  . hydrochlorothiazide (HYDRODIURIL) 25 MG tablet Take 1 tablet (25 mg total) by mouth daily.  Marland Kitchen levothyroxine (SYNTHROID) 75 MCG tablet Take 75 mcg by mouth daily.  Marland Kitchen lisinopril (ZESTRIL) 40 MG tablet Take 40 mg by mouth daily.  Marland Kitchen loratadine (CLARITIN) 10 MG tablet Take 10 mg by mouth.  . Mag Aspart-Potassium Aspart (POTASSIUM & MAGNESIUM ASPARTAT PO) Take 1 tablet by mouth daily.  . memantine (NAMENDA) 10 MG tablet Take 10 mg by mouth 2 (two) times daily.   No facility-administered encounter medications on file as of 11/15/2019.     Allergies (verified) Alendronate, Codeine, Hydrocodone-acetaminophen, Erythromycin base, and Sulfa antibiotics   History: Past Medical History:  Diagnosis Date  . AAT (alpha-1-antitrypsin) deficiency (HCC) 01/13/2017  . Hypertension   . Memory loss    Past Surgical History:  Procedure Laterality Date  . APPENDECTOMY    . ELBOW SURGERY Right   . PORT WINE STAIN REMOVAL W/ LASER     Family History  Problem Relation Age of Onset  . Stroke Mother   . Heart disease Father  Social History   Socioeconomic History  . Marital status: Married    Spouse name: Not on file  . Number of children: 3  . Years of education: some college  . Highest education level: Not on file  Occupational History  . Occupation: retired  Scientific laboratory technician  . Financial resource strain: Not on file  . Food insecurity    Worry: Not on file    Inability: Not on file  . Transportation needs    Medical: Not on file    Non-medical: Not on file  Tobacco Use  . Smoking status: Former Smoker    Packs/day: 1.00    Years: 15.00    Pack years: 15.00    Types: Cigarettes     Quit date: 01/13/2010    Years since quitting: 9.8  . Smokeless tobacco: Never Used  Substance and Sexual Activity  . Alcohol use: No  . Drug use: No  . Sexual activity: Not on file  Lifestyle  . Physical activity    Days per week: Not on file    Minutes per session: Not on file  . Stress: Not on file  Relationships  . Social Herbalist on phone: Not on file    Gets together: Not on file    Attends religious service: Not on file    Active member of club or organization: Not on file    Attends meetings of clubs or organizations: Not on file    Relationship status: Not on file  Other Topics Concern  . Not on file  Social History Narrative   Lives at home with husband and grandson.   Right-handed.   Occasional caffeine use.   Tobacco Counseling Counseling given: Not Answered   Clinical Intake:  Pain : No/denies pain    Activities of Daily Living In your present state of health, do you have any difficulty performing the following activities: 11/15/2019 04/11/2019  Hearing? N N  Vision? N N  Difficulty concentrating or making decisions? N N  Walking or climbing stairs? N N  Dressing or bathing? N N  Doing errands, shopping? Y N  Preparing Food and eating ? N -  Using the Toilet? N -  In the past six months, have you accidently leaked urine? N -  Do you have problems with loss of bowel control? N -  Managing your Medications? N -  Managing your Finances? Y -  Housekeeping or managing your Housekeeping? N -  Some recent data might be hidden     Immunizations and Health Maintenance Immunization History  Administered Date(s) Administered  . Influenza, High Dose Seasonal PF 09/30/2017, 10/03/2018, 08/26/2019  . Influenza-Unspecified 09/12/2016  . Pneumococcal Conjugate-13 06/18/2016  . Pneumococcal Polysaccharide-23 08/19/2010  . Tdap 04/02/2009  . Zoster 04/15/2009   There are no preventive care reminders to display for this patient.  Patient Care Team:  Lucille Passy, MD as PCP - General (Family Medicine)  Indicate any recent Medical Services you may have received from other than [facility] providers in the past year (date may be approximate).    Assessment:   This is a routine wellness examination for Farra. Physical assessment deferred to PCP.  Hearing/Vision screen Unable to assess. This visit is enabled though telemedicine due to Covid 19.   Dietary issues and exercise activities discussed: Current Exercise Habits: The patient does not participate in regular exercise at present(has not been going to gym d/t Covid-19), Exercise limited by: None identified Diet (meal preparation,  eat out, water intake, caffeinated beverages, dairy products, fruits and vegetables): 24 hr recall Breakfast: oatmeal and raisin toast. Lunch: soup Dinner: cookies.  Goals    . Patient Stated     Continue doing brain stimulating activities (puzzles, reading, adult coloring books, staying active) to keep memory sharp.        Depression Screen PHQ 2/9 Scores 11/15/2019 10/12/2019 09/04/2019 04/11/2019  PHQ - 2 Score 0 0 - -  Exception Documentation - - Other- indicate reason in comment box Other- indicate reason in comment box  Not completed - - acute Acute    Fall Risk Fall Risk  11/15/2019 11/15/2019 04/11/2019 01/13/2017  Falls in the past year? 0 0 0 Yes  Number falls in past yr: - - - 1  Injury with Fall? - - - Yes  Risk Factor Category  - - - High Fall Risk  Follow up Education provided;Falls prevention discussed Education provided;Falls prevention discussed Falls evaluation completed Falls prevention discussed    Cognitive Function:  MMSE - Mini Mental State Exam 06/05/2019  Orientation to time 2  Orientation to Place 5  Registration 3  Attention/ Calculation 3  Recall 2  Language- name 2 objects 2  Language- repeat 1  Language- follow 3 step command 3  Language- read & follow direction 1  Write a sentence 1  Copy design 1  Total score 24      6CIT Screen 11/15/2019  What Year? 0 points  What month? 0 points  Count back from 20 2 points  Months in reverse 4 points  Repeat phrase 10 points    Screening Tests Health Maintenance  Topic Date Due  . MAMMOGRAM  09/03/2020 (Originally 11/28/1994)  . DEXA SCAN  09/03/2020 (Originally 11/28/2009)  . TETANUS/TDAP  09/03/2020 (Originally 04/03/2019)  . COLONOSCOPY  03/11/2027  . INFLUENZA VACCINE  Completed  . Hepatitis C Screening  Completed  . PNA vac Low Risk Adult  Completed       Plan:   See you next year!  Continue to eat heart healthy diet (full of fruits, vegetables, whole grains, lean protein, water--limit salt, fat, and sugar intake) and increase physical activity as tolerated.  Continue doing brain stimulating activities (puzzles, reading, adult coloring books, staying active) to keep memory sharp.     I have personally reviewed and noted the following in the patient's chart:   . Medical and social history . Use of alcohol, tobacco or illicit drugs  . Current medications and supplements . Functional ability and status . Nutritional status . Physical activity . Advanced directives . List of other physicians . Hospitalizations, surgeries, and ER visits in previous 12 months . Vitals . Screenings to include cognitive, depression, and falls . Referrals and appointments  In addition, I have reviewed and discussed with patient certain preventive protocols, quality metrics, and best practice recommendations. A written personalized care plan for preventive services as well as general preventive health recommendations were provided to patient.     Avon GullyBritt, Kadeshia Kasparian Angel, [place]RN   11/15/2019

## 2019-11-15 ENCOUNTER — Encounter: Payer: Self-pay | Admitting: *Deleted

## 2019-11-15 ENCOUNTER — Ambulatory Visit (INDEPENDENT_AMBULATORY_CARE_PROVIDER_SITE_OTHER): Payer: Medicare HMO | Admitting: *Deleted

## 2019-11-15 DIAGNOSIS — Z0000 Encounter for general adult medical examination without abnormal findings: Secondary | ICD-10-CM

## 2019-11-15 NOTE — Patient Instructions (Addendum)
See you next year!  Continue to eat heart healthy diet (full of fruits, vegetables, whole grains, lean protein, water--limit salt, fat, and sugar intake) and increase physical activity as tolerated.  Continue doing brain stimulating activities (puzzles, reading, adult coloring books, staying active) to keep memory sharp.    Veronica Meza , Thank you for taking time to come for your Medicare Wellness Visit. I appreciate your ongoing commitment to your health goals. Please review the following plan we discussed and let me know if I can assist you in the future.   These are the goals we discussed: Goals    . Patient Stated     Continue doing brain stimulating activities (puzzles, reading, adult coloring books, staying active) to keep memory sharp.         This is a list of the screening recommended for you and due dates:  Health Maintenance  Topic Date Due  . Mammogram  09/03/2020*  . DEXA scan (bone density measurement)  09/03/2020*  . Tetanus Vaccine  09/03/2020*  . Colon Cancer Screening  03/11/2027  . Flu Shot  Completed  .  Hepatitis C: One time screening is recommended by Center for Disease Control  (CDC) for  adults born from 1945 through 1965.   Completed  . Pneumonia vaccines  Completed  *Topic was postponed. The date shown is not the original due date.    Preventive Care 65 Years and Older, Female Preventive care refers to lifestyle choices and visits with your health care provider that can promote health and wellness. This includes:  A yearly physical exam. This is also called an annual well check.  Regular dental and eye exams.  Immunizations.  Screening for certain conditions.  Healthy lifestyle choices, such as diet and exercise. What can I expect for my preventive care visit? Physical exam Your health care provider will check:  Height and weight. These may be used to calculate body mass index (BMI), which is a measurement that tells if you are at a healthy  weight.  Heart rate and blood pressure.  Your skin for abnormal spots. Counseling Your health care provider may ask you questions about:  Alcohol, tobacco, and drug use.  Emotional well-being.  Home and relationship well-being.  Sexual activity.  Eating habits.  History of falls.  Memory and ability to understand (cognition).  Work and work environment.  Pregnancy and menstrual history. What immunizations do I need?  Influenza (flu) vaccine  This is recommended every year. Tetanus, diphtheria, and pertussis (Tdap) vaccine  You may need a Td booster every 10 years. Varicella (chickenpox) vaccine  You may need this vaccine if you have not already been vaccinated. Zoster (shingles) vaccine  You may need this after age 60. Pneumococcal conjugate (PCV13) vaccine  One dose is recommended after age 65. Pneumococcal polysaccharide (PPSV23) vaccine  One dose is recommended after age 65. Measles, mumps, and rubella (MMR) vaccine  You may need at least one dose of MMR if you were born in 1957 or later. You may also need a second dose. Meningococcal conjugate (MenACWY) vaccine  You may need this if you have certain conditions. Hepatitis A vaccine  You may need this if you have certain conditions or if you travel or work in places where you may be exposed to hepatitis A. Hepatitis B vaccine  You may need this if you have certain conditions or if you travel or work in places where you may be exposed to hepatitis B. Haemophilus influenzae type b (  Hib) vaccine  You may need this if you have certain conditions. You may receive vaccines as individual doses or as more than one vaccine together in one shot (combination vaccines). Talk with your health care provider about the risks and benefits of combination vaccines. What tests do I need? Blood tests  Lipid and cholesterol levels. These may be checked every 5 years, or more frequently depending on your overall health.   Hepatitis C test.  Hepatitis B test. Screening  Lung cancer screening. You may have this screening every year starting at age 48 if you have a 30-pack-year history of smoking and currently smoke or have quit within the past 15 years.  Colorectal cancer screening. All adults should have this screening starting at age 57 and continuing until age 32. Your health care provider may recommend screening at age 36 if you are at increased risk. You will have tests every 1-10 years, depending on your results and the type of screening test.  Diabetes screening. This is done by checking your blood sugar (glucose) after you have not eaten for a while (fasting). You may have this done every 1-3 years.  Mammogram. This may be done every 1-2 years. Talk with your health care provider about how often you should have regular mammograms.  BRCA-related cancer screening. This may be done if you have a family history of breast, ovarian, tubal, or peritoneal cancers. Other tests  Sexually transmitted disease (STD) testing.  Bone density scan. This is done to screen for osteoporosis. You may have this done starting at age 20. Follow these instructions at home: Eating and drinking  Eat a diet that includes fresh fruits and vegetables, whole grains, lean protein, and low-fat dairy products. Limit your intake of foods with high amounts of sugar, saturated fats, and salt.  Take vitamin and mineral supplements as recommended by your health care provider.  Do not drink alcohol if your health care provider tells you not to drink.  If you drink alcohol: ? Limit how much you have to 0-1 drink a day. ? Be aware of how much alcohol is in your drink. In the [place] one drink equals one 12 oz bottle of beer (355 mL), one 5 oz glass of wine (148 mL), or one 1 oz glass of hard liquor (44 mL). Lifestyle  Take daily care of your teeth and gums.  Stay active. Exercise for at least 30 minutes on 5 or more days each week.   Do not use any products that contain nicotine or tobacco, such as cigarettes, e-cigarettes, and chewing tobacco. If you need help quitting, ask your health care provider.  If you are sexually active, practice safe sex. Use a condom or other form of protection in order to prevent STIs (sexually transmitted infections).  Talk with your health care provider about taking a low-dose aspirin or statin. What's next?  Go to your health care provider once a year for a well check visit.  Ask your health care provider how often you should have your eyes and teeth checked.  Stay up to date on all vaccines. This information is not intended to replace advice given to you by your health care provider. Make sure you discuss any questions you have with your health care provider. Document Released: 12/20/2015 Document Revised: 11/17/2018 Document Reviewed: 11/17/2018 Elsevier Patient Education  2020 [employer].

## 2019-11-23 ENCOUNTER — Telehealth: Payer: Self-pay | Admitting: Family Medicine

## 2019-11-23 NOTE — Telephone Encounter (Signed)
Please see message and advise.  Thank you. ° °

## 2019-11-23 NOTE — Telephone Encounter (Addendum)
Pt is calling and would like to have home health aid to come in 5 day a week and assistant with cleaning, medication management, shopping and bathing the patient son is qualify robert ruscoe to provider assistant for them and he would like to be there home health aid and son does not work through agency he is self employed. Please send order to [employer]

## 2019-11-24 NOTE — Telephone Encounter (Signed)
I spoke with pt's son to scheduled a virtual visit with Dr. Aron. 

## 2019-11-24 NOTE — Progress Notes (Signed)
Virtual Visit via Video   Due to the COVID-19 pandemic, this visit was completed with telemedicine (audio/video) technology to reduce patient and provider exposure as well as to preserve personal protective equipment.   I connected with Veronica Meza by a video enabled telemedicine application and verified that I am speaking with the correct person using two identifiers. Location patient: Home Location provider: [facility], Office Persons participating in the virtual visit: Daiva Nakayama, Arnette Norris, MD   I discussed the limitations of evaluation and management by telemedicine and the availability of in person appointments. The patient expressed understanding and agreed to proceed.  Care Team   Patient Care Team: Lucille Passy, MD as PCP - General (Family Medicine)  Subjective:   HPI:  Home health evaluation-   Pt sent the following message- rapidly progressing dementia.  She is not safe to drive, cook, clean on her own.  Cannot leave the house.  Needs OT as well as she is losing upper body strength.  Needs help with medication management.  She is taking Namenda.  "Pt is calling and would like to have home health aid to come in 5 day a week and assistant with cleaning, medication management, shopping and bathing the patient son is qualify robert ruscoe to provider assistant for them and he would like to be there home health aid and son does not work through agency he is self employed. Please send order to [employer]"          Review of Systems  Constitutional: Negative for fever and malaise/fatigue.  HENT: Negative for congestion and hearing loss.   Eyes: Negative for blurred vision, discharge and redness.  Respiratory: Negative for cough and shortness of breath.   Cardiovascular: Negative for chest pain, palpitations and leg swelling.  Gastrointestinal: Negative for abdominal pain and heartburn.  Genitourinary: Negative for dysuria.  Musculoskeletal: Negative  for falls.  Skin: Negative for rash.  Neurological: Positive for focal weakness and weakness. Negative for loss of consciousness and headaches.  Endo/Heme/Allergies: Does not bruise/bleed easily.  Psychiatric/Behavioral: Positive for memory loss. Negative for depression.  All other systems reviewed and are negative.    Patient Active Problem List   Diagnosis Date Noted  . Need for home health care 11/28/2019  . 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 Memory loss 06/05/2019  . Osteoporosis 05/21/2019  . Vitamin D deficiency 05/21/2019  . Early onset Alzheimer's dementia without behavioral disturbance ([place]) 05/19/2019  . HTN (hypertension) 04/11/2019  . HLD (hyperlipidemia) 04/11/2019  . Orthostatic dizziness 04/11/2019  . Hypothyroidism 04/11/2019  . Weakness 04/11/2019  . Hereditary hemochromatosis ([place]) 02/05/2017  . AAT (alpha-1-antitrypsin) deficiency ([place]) 01/13/2017    Social History   Tobacco Use  . Smoking status: Former Smoker    Packs/day: 1.00    Years: 15.00    Pack years: 15.00    Types: Cigarettes    Quit date: 01/13/2010    Years since quitting: 9.8  . Smokeless tobacco: Never Used  Substance Use Topics  . Alcohol use: No    Current Outpatient Medications:  .  atorvastatin (LIPITOR) 40 MG tablet, Take 40 mg by mouth daily., Disp: , Rfl:  .  busPIRone (BUSPAR) 10 MG tablet, Take 10 mg by mouth 2 (two) times daily as needed. , Disp: , Rfl:  .  Cholecalciferol (VITAMIN D3) 125 MCG (5000 UT) TABS, Take by mouth., Disp: , Rfl:  .  gabapentin (NEURONTIN) 300 MG capsule, Take 300 mg by mouth  3 (three) times daily., Disp: , Rfl:  .  hydrochlorothiazide (HYDRODIURIL) 25 MG tablet, Take 1 tablet (25 mg total) by mouth daily., Disp: 90 tablet, Rfl: 3 .  levothyroxine (SYNTHROID) 75 MCG tablet, Take 75 mcg by mouth daily., Disp: , Rfl:  .  lisinopril (ZESTRIL) 40 MG tablet, Take 40 mg by mouth daily., Disp: , Rfl:  .  loratadine (CLARITIN) 10 MG tablet, Take 10 mg  by mouth., Disp: , Rfl:  .  Mag Aspart-Potassium Aspart (POTASSIUM & MAGNESIUM ASPARTAT PO), Take 1 tablet by mouth daily., Disp: , Rfl:  .  memantine (NAMENDA) 10 MG tablet, Take 10 mg by mouth 2 (two) times daily., Disp: , Rfl:   Allergies  Allergen Reactions  . Alendronate Nausea Only  . Codeine Other (See Comments)    REACTION: Generalized swelling  . Hydrocodone-Acetaminophen Swelling    REACTION: Generalized swelling   . Erythromycin Base Nausea Only  . Sulfa Antibiotics Itching and Nausea And Vomiting    Objective:  BP (!) 155/67   Pulse (!) 50   Temp 97.9 F (36.6 C)   Wt 116 lb (52.6 kg)   BMI 21.22 kg/m    Wt Readings from Last 3 Encounters:  11/28/19 116 lb (52.6 kg)  09/05/19 119 lb (54 kg)  09/04/19 119 lb 12.8 oz (54.3 kg)    VITALS: Per patient if applicable, see vitals. GENERAL: Alert, appears well and in no acute distress. HEENT: Atraumatic, conjunctiva clear, no obvious abnormalities on inspection of external nose and ears. NECK: Normal movements of the head and neck. CARDIOPULMONARY: No increased WOB. Speaking in clear sentences. I:E ratio WNL.  MS: Moves all visible extremities without noticeable abnormality. PSYCH: Pleasant and cooperative, well-groomed. Speech normal rate and rhythm. Affect is appropriate. Insight and judgement are appropriate. Attention is focused, linear, and appropriate.  NEURO: CN grossly intact. Oriented as arrived to appointment on time with no prompting. Moves both UE equally.  SKIN: No obvious lesions, wounds, erythema, or cyanosis noted on face or hands.  Depression screen [facility] 2/9 11/15/2019 10/12/2019  Decreased Interest 0 0  Down, Depressed, Hopeless 0 0  PHQ - 2 Score 0 0     . COVID-19 Education: The signs and symptoms of COVID-19 were discussed with the patient and how to seek care for testing if needed. The importance of social distancing was discussed today. . Reviewed expectations re: course of current medical  issues. . Discussed self-management of symptoms. . Outlined signs and symptoms indicating need for more acute intervention. . Patient verbalized understanding and all questions were answered. Marland Kitchen Health Maintenance issues including appropriate healthy diet, exercise, and smoking avoidance were discussed with patient. . See orders for this visit as documented in the electronic medical record.  Arnette Norris, MD  Records requested if needed. Time spent: 25 minutes, of which >50% was spent in obtaining information about her symptoms, reviewing her previous labs, evaluations, and treatments, counseling her about her condition (please see the discussed topics above), and developing a plan to further investigate it; she had a number of questions which I addressed.   Lab Results  Component Value Date   WBC 6.7 09/04/2019   HGB 13.8 09/04/2019   HCT 41.1 09/04/2019   PLT 168.0 09/04/2019   GLUCOSE 96 10/09/2019   ALT 55 (H) 10/09/2019   AST 68 (H) 10/09/2019   NA 139 10/09/2019   K 3.9 10/09/2019   CL 102 10/09/2019   CREATININE 0.75 10/09/2019   BUN 14 10/09/2019  CO2 28 10/09/2019   TSH 1.91 09/04/2019   HGBA1C 6.1 09/04/2019    Lab Results  Component Value Date   TSH 1.91 09/04/2019   Lab Results  Component Value Date   WBC 6.7 09/04/2019   HGB 13.8 09/04/2019   HCT 41.1 09/04/2019   MCV 96.9 09/04/2019   PLT 168.0 09/04/2019   Lab Results  Component Value Date   NA 139 10/09/2019   K 3.9 10/09/2019   CHLORIDE 106 04/29/2017   CO2 28 10/09/2019   GLUCOSE 96 10/09/2019   BUN 14 10/09/2019   CREATININE 0.75 10/09/2019   BILITOT 0.5 10/09/2019   ALKPHOS 61 10/09/2019   AST 68 (H) 10/09/2019   ALT 55 (H) 10/09/2019   PROT 7.2 10/09/2019   ALBUMIN 4.2 10/09/2019   CALCIUM 10.0 10/09/2019   ANIONGAP 9 04/21/2019   EGFR 73 (L) 04/29/2017   GFR 75.36 10/09/2019   No results found for: CHOL No results found for: HDL No results found for: LDLCALC No results found for:  TRIG No results found for: CHOLHDL Lab Results  Component Value Date   HGBA1C 6.1 09/04/2019       Assessment & Plan:   Problem List Items Addressed This Visit      Active Problems   Early onset Alzheimer's dementia without behavioral disturbance ([place]) - Primary    Rapidly progressing dementia.      Need for home health care    Home Health Documentation: Face To Face Encounter  Documentation Follows: Pt needs Home Health services for the following reasons: Pt is elderly,unable to leave the home without excessive effort, unable to drive,  and is at significant risk of falling.         I have discontinued Nessie Nong. Stfleur's donepezil. I am also having her maintain her atorvastatin, busPIRone, loratadine, gabapentin, levothyroxine, lisinopril, Mag Aspart-Potassium Aspart (POTASSIUM & MAGNESIUM ASPARTAT PO), Vitamin D3, hydrochlorothiazide, and memantine.  No orders of the defined types were placed in this encounter.    Arnette Norris, MD

## 2019-11-24 NOTE — Telephone Encounter (Signed)
Typically, an OV (can be virtual) is required before insurance will accept an order like this.  Since this is a unique situation, the son may know more.  But every home health order I've ever done, it is a requirement to have an OV that addresses home health needs alone.  Please let me know what her son says.

## 2019-11-28 ENCOUNTER — Encounter: Payer: Self-pay | Admitting: Family Medicine

## 2019-11-28 ENCOUNTER — Other Ambulatory Visit: Payer: Self-pay

## 2019-11-28 ENCOUNTER — Ambulatory Visit (INDEPENDENT_AMBULATORY_CARE_PROVIDER_SITE_OTHER): Payer: Medicare HMO | Admitting: Family Medicine

## 2019-11-28 VITALS — BP 155/67 | HR 50 | Temp 97.9°F | Wt 116.0 lb

## 2019-11-28 DIAGNOSIS — F0280 Dementia in other diseases classified elsewhere without behavioral disturbance: Secondary | ICD-10-CM | POA: Diagnosis not present

## 2019-11-28 DIAGNOSIS — Z742 Need for assistance at home and no other household member able to render care: Secondary | ICD-10-CM | POA: Insufficient documentation

## 2019-11-28 DIAGNOSIS — G300 Alzheimer's disease with early onset: Secondary | ICD-10-CM | POA: Diagnosis not present

## 2019-11-28 NOTE — Assessment & Plan Note (Signed)
Rapidly progressing dementia.

## 2019-11-28 NOTE — Assessment & Plan Note (Signed)
Home Health Documentation: Face To Face Encounter  Documentation Follows: Pt needs Home Health services for the following reasons: Pt is elderly,unable to leave the home without excessive effort, unable to drive,  and is at significant risk of falling.

## 2019-12-18 ENCOUNTER — Telehealth: Payer: Self-pay | Admitting: Family Medicine

## 2019-12-18 NOTE — Telephone Encounter (Signed)
I called number on file, with no answer.  No VM set up.

## 2019-12-18 NOTE — Telephone Encounter (Signed)
I spoke with pt and informed her that Home Health Care can call office to give RX order.

## 2019-12-18 NOTE — Telephone Encounter (Signed)
Patient is calling about a RX that was suppose to mailed to patient for home health care that they need to give to [employer]. Patient does not need a referral put in for the home care, they just need the rx. Patient stated that they can come and pick it up when it is ready.  

## 2019-12-30 ENCOUNTER — Other Ambulatory Visit: Payer: Self-pay | Admitting: Neurology

## 2020-03-05 ENCOUNTER — Ambulatory Visit: Payer: Medicare HMO | Admitting: Neurology

## 2020-11-07 IMAGING — DX CHEST - 2 VIEW
2 series · 2 of 2 positions shown · non-contrast
Comparison: Prior chest x-ray from [DATE] is not available for
comparison.

CLINICAL DATA: Pneumonia

EXAM:
CHEST - 2 VIEW

[chest pa]
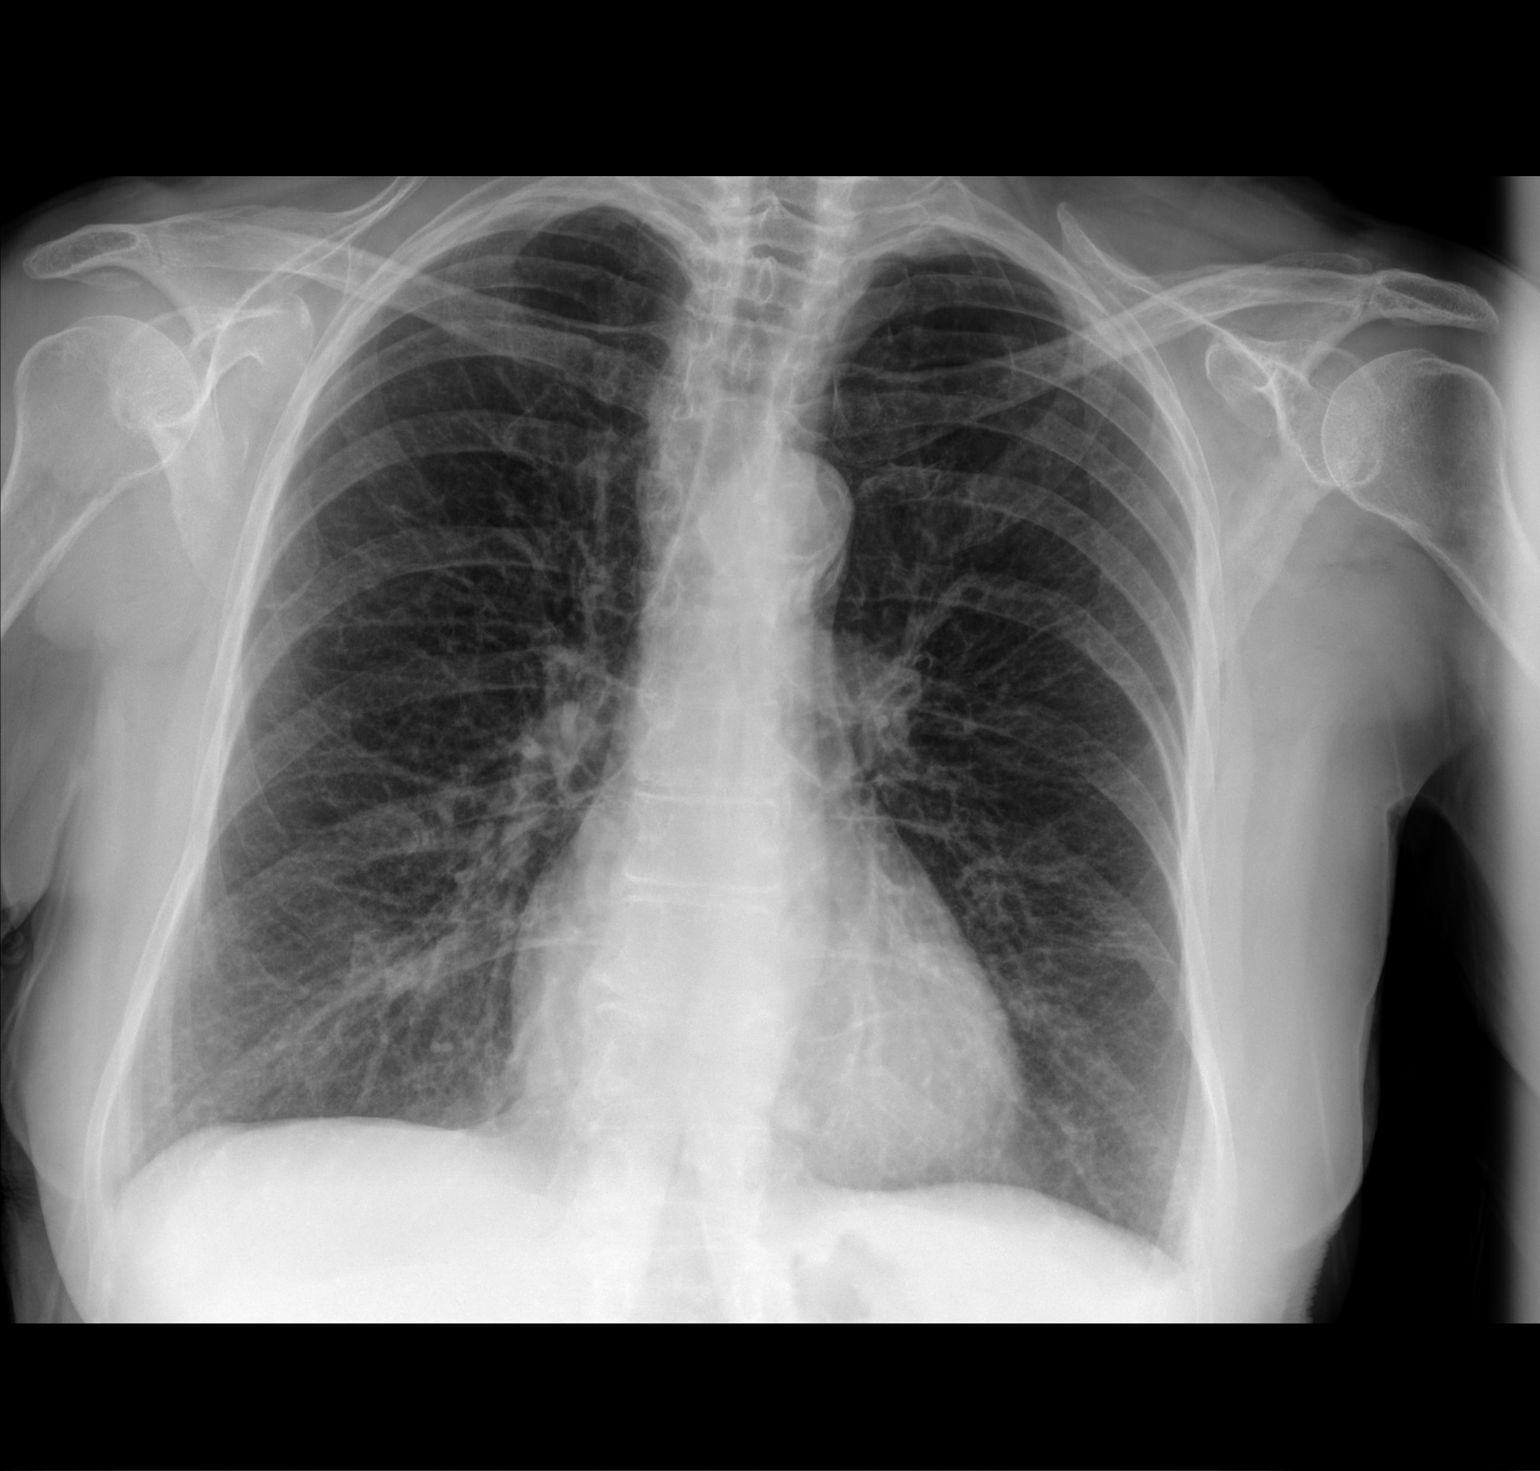

[chest lat]
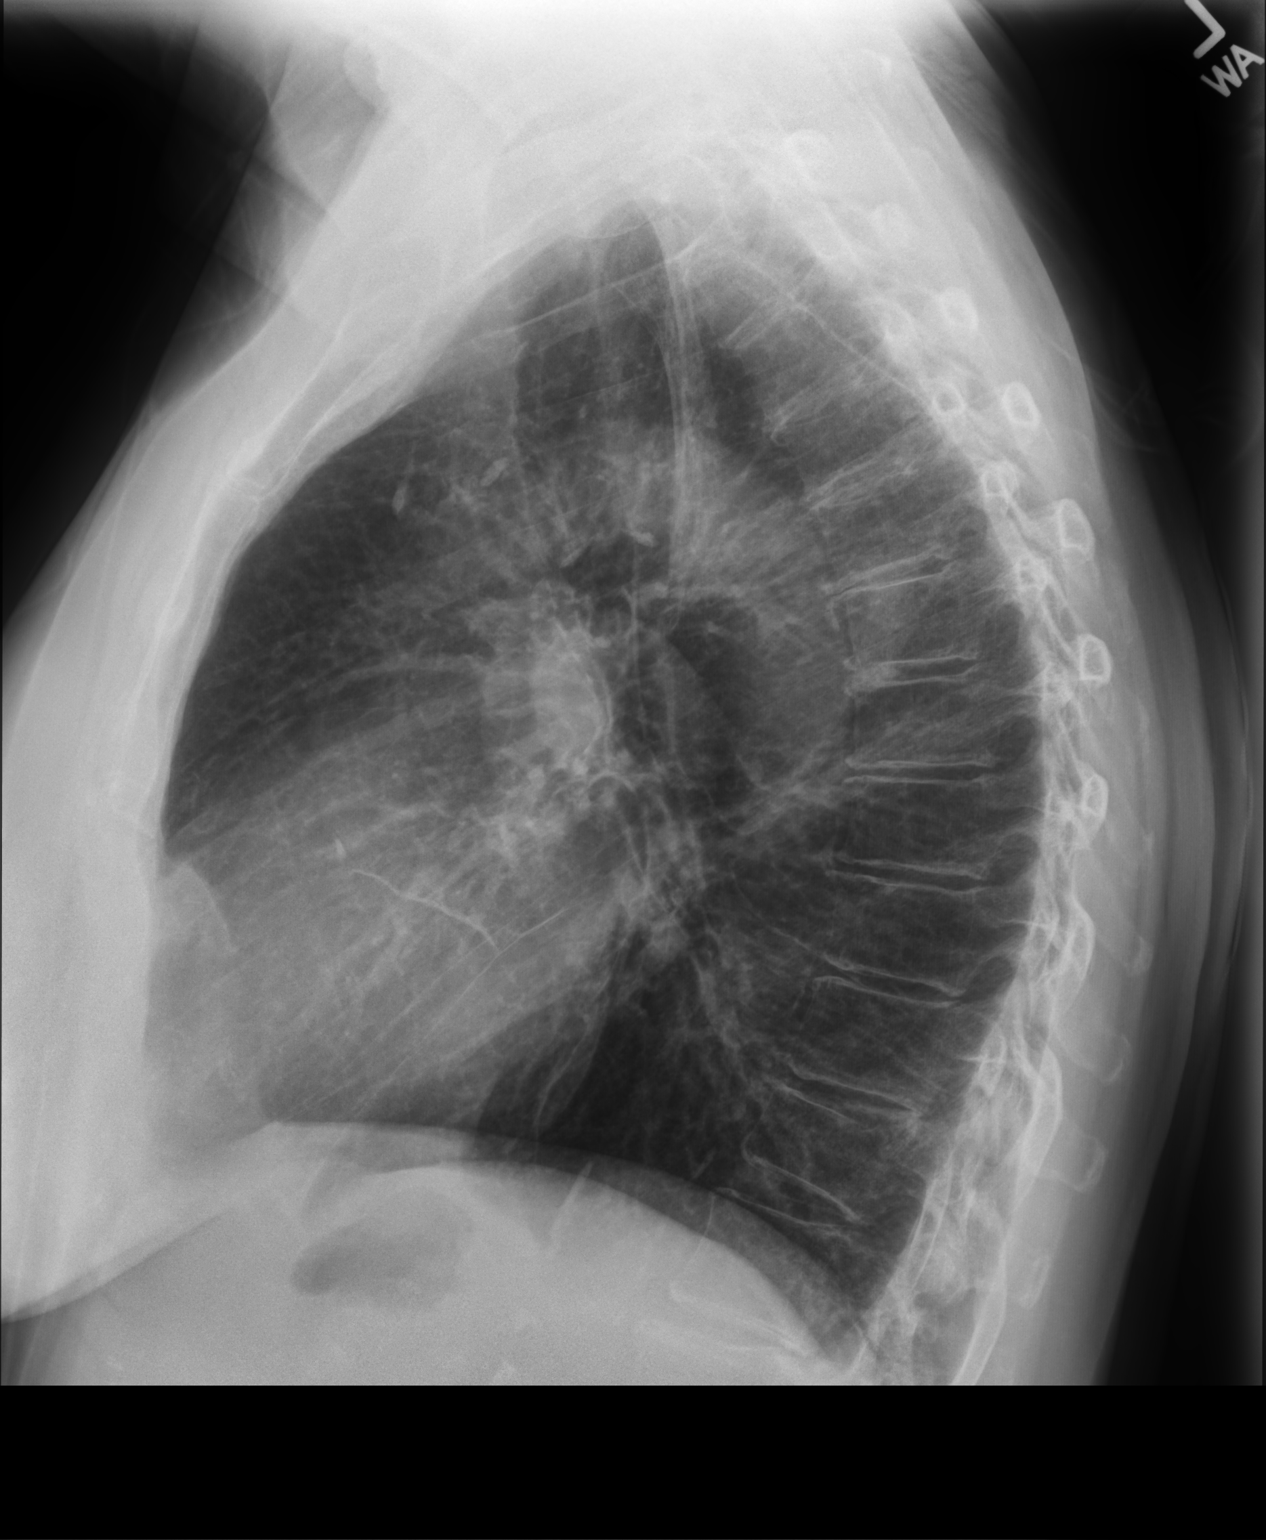

[2 of 2 positions shown; findings below may reference images not displayed]

FINDINGS: There may be a background of emphysematous changes bilaterally.
Atherosclerotic changes are noted of the thoracic aorta. The cardiac
silhouette is unremarkable. No pneumothorax. No large area of
consolidation. There is a slightly greater than expected density in
the right infrahilar region which may represent a resolving
pneumonia in the appropriate clinical setting.
IMPRESSION: Right infrahilar airspace opacity may represent a resolving
pneumonia. The patient's prior chest x-ray is not available for
comparison.

## 2023-05-04 ENCOUNTER — Ambulatory Visit
Admission: EM | Admit: 2023-05-04 | Discharge: 2023-05-04 | Disposition: A | Discharge status: Home / Self Care | Payer: Medicare HMO

## 2023-05-04 ENCOUNTER — Encounter: Payer: Self-pay | Admitting: Family

## 2023-05-04 DIAGNOSIS — I10 Essential (primary) hypertension: Secondary | ICD-10-CM

## 2023-05-04 DIAGNOSIS — B349 Viral infection, unspecified: Secondary | ICD-10-CM | POA: Diagnosis not present

## 2023-05-04 NOTE — ED Triage Notes (Signed)
Patient to Urgent Care with son, complaints of dry cough/ fevers/ fatigue. Symptoms started four days ago.   Hx of dementia- just moved into the area and son believes the stress of the move could be contributing. Intermittent fevers as high as 102.5. Persistent left sided swollen glands.   Son has been pushing fluids/ tylenol/ ibuprofen/ mucinex/ cough drops.

## 2023-05-04 NOTE — ED Provider Notes (Signed)
Renaldo Fiddler    CSN: [id number] Arrival date & time: 05/04/23  1303      History   Chief Complaint Chief Complaint  Patient presents with   Fever   Cough    HPI Veronica Meza is a 77 y.o. female.  Accompanied by her son, patient presents with 4 day history of fever and cough.  Tmax 102.5.  Treating with Tylenol, ibuprofen, Mucinex.  Her cough is nonproductive.  No rash, chest pain, shortness of breath, vomiting, diarrhea,  or other symptoms.  Patient was seen by her PCP on 04/13/2023; diagnosed with viral illness.  Her PCP prescribed amoxicillin on 04/17/2023 with diagnosis of acute otitis media.  Her medical history includes hypertension, Alzheimer's disease, hypothyroidism.  Patient and her son recently moved to [facility]; patient is scheduled to be seen by her new PCP tomorrow.    The history is provided by the patient, a relative and medical records.    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      Home Medications    Prior to Admission medications   Medication Sig Start Date End Date Taking? Authorizing Provider  CHEST CONGESTION RELIEF 100  MG/5ML liquid Take by mouth. 03/11/23  Yes [provider]  levothyroxine (SYNTHROID) 88 MCG tablet Take by mouth. 01/19/23  Yes [provider]  lisinopril (ZESTRIL) 40 MG tablet Take 1 tablet by mouth daily. 10/07/22  Yes [provider]  LORazepam (ATIVAN) 0.5 MG tablet Take 1/2 tab (0.25 mg) to 1 tablet (0.5 mg) every 8 hours as needed for anxiety. 07/20/22  Yes [provider]  MELATONIN PLUS L-THEANINE 10-5.5 MG TABS Take 1 tablet by mouth at bedtime. 04/08/23  Yes [provider]  mirtazapine (REMERON) 15 MG tablet Take by mouth. 07/10/22  Yes [provider]  SENEXON-S 8.6-50 MG tablet SMARTSIG:1-4 Tablet(s) By Mouth Twice Daily PRN 04/08/23  Yes [provider]  sertraline (ZOLOFT) 25 MG tablet Take by mouth. 01/19/23  Yes [provider]  spironolactone (ALDACTONE) 25 MG tablet Take by mouth. 01/07/23  Yes [provider]  atorvastatin (LIPITOR) 40 MG tablet Take 40 mg by mouth daily.    [provider]  busPIRone (BUSPAR) 10 MG tablet Take 10 mg by mouth 2 (two) times daily as needed.     [provider]  Cholecalciferol (VITAMIN D3) 125 MCG (5000 UT) TABS Take by mouth.    [provider]  fentaNYL (DURAGESIC) 12 MCG/HR PLEASE SEE ATTACHED FOR DETAILED DIRECTIONS    [provider]  gabapentin (NEURONTIN) 300 MG capsule Take 300 mg by mouth 3 (three) times daily. 03/26/19  [provider]  hydrALAZINE (APRESOLINE) 25 MG tablet Take 50 mg by mouth 2 (two) times daily.    [provider]  hydrochlorothiazide (HYDRODIURIL) 25 MG tablet Take 1 tablet (25 mg total) by mouth daily. 10/12/19   Dianne Dun, MD  levothyroxine (SYNTHROID) 75 MCG tablet Take 75 mcg by mouth daily. 02/13/19   [provider]  lisinopril (ZESTRIL) 40 MG tablet Take 40 mg by mouth daily. 02/13/19   [provider]  loratadine (CLARITIN) 10 MG tablet Take 10 mg by mouth.    [provider]  Mag Aspart-Potassium Aspart (POTASSIUM & MAGNESIUM ASPARTAT PO) Take 1 tablet by mouth daily.    [provider]  memantine (NAMENDA) 10 MG tablet TAKE 1 TABLET BY MOUTH TWICE A DAY 01/02/20   Levert Feinstein, MD    Family History Family History  Problem Relation Age of Onset   Stroke Mother    Heart disease Father     Social History Social History   Tobacco Use   Smoking status: Former    Packs/day: 1.00    Years: 15.00    Additional pack years: 0.00    Total pack years: 15.00    Types: Cigarettes    Quit date: 01/13/2010    Years since quitting: 13.3   Smokeless tobacco: Never  Vaping Use   Vaping Use: Never used  Substance Use Topics   Alcohol use: No   Drug use: No     Allergies   Alendronate, Codeine, Hydrocodone-acetaminophen, Erythromycin base, and Sulfa antibiotics   Review of Systems Review of Systems  Constitutional:  Positive for fever. Negative for chills.  HENT:  Negative for ear pain and sore throat.   Respiratory:  Positive for cough. Negative for shortness of breath.   Cardiovascular:  Negative for chest pain and palpitations.  Gastrointestinal:  Negative for diarrhea and vomiting.     Physical Exam Triage Vital Signs ED Triage Vitals [05/04/23 1316]  Enc Vitals Group     BP      Pulse Rate 82     Resp 18     Temp 98.6 F (37 C)     Temp src      SpO2 95 %     Weight      Height      Head Circumference      Peak Flow      Pain Score      Pain Loc      Pain Edu?      Excl. in GC?    No data found.  Updated Vital Signs BP (!) 168/63   Pulse 82   Temp 98.6 F (37 C)   Resp 18   SpO2 95%   Visual Acuity Right Eye Distance:   Left Eye Distance:   Bilateral Distance:    Right Eye Near:   Left Eye Near:    Bilateral Near:     Physical Exam Vitals and nursing note reviewed.  Constitutional:      General: She is not in acute distress.    Appearance: Normal appearance. She is well-developed. She is not  ill-appearing.  HENT:     Right Ear: Tympanic membrane normal.     Left Ear: Tympanic membrane normal.     Nose: Nose normal.     Mouth/Throat:     Mouth: Mucous membranes are moist.     Pharynx: Oropharynx is clear.     Comments: Clear PND. Cardiovascular:  Rate and Rhythm: Normal rate and regular rhythm.     Heart sounds: Normal heart sounds.  Pulmonary:     Effort: Pulmonary effort is normal. No respiratory distress.     Breath sounds: Normal breath sounds. No wheezing, rhonchi or rales.  Musculoskeletal:     Cervical back: Neck supple.  Skin:    General: Skin is warm and dry.  Neurological:     Mental Status: She is alert.  Psychiatric:        Mood and Affect: Mood normal.        Behavior: Behavior normal.      UC Treatments / Results  Labs (all labs ordered are listed, but only abnormal results are displayed) Labs Reviewed - No data to display  EKG   Radiology No results found.  Procedures Procedures (including critical care time)  Medications Ordered in UC Medications - No data to display  Initial Impression / Assessment and Plan / UC Course  I have reviewed the triage vital signs and the nursing notes.  Pertinent labs & imaging results that were available during my care of the patient were reviewed by me and considered in my medical decision making (see chart for details).    Viral illness.  Elevated blood pressure with hypertension.  No indication of bacterial infection at this time.  Lungs are clear, O2 sat 95% on room air.  Discussed continued symptomatic treatment including Tylenol as needed.  Education provided on viral illness.  Patient and her son decline COVID test today.  She is scheduled to see her new PCP tomorrow.  Discussed that her blood pressure is elevated today and needs to be rechecked by her PCP.  Education provided on managing hypertension.  Patient and her son agree to plan of care.  Final Clinical Impressions(s) / UC Diagnoses    Final diagnoses:  Viral illness  Elevated blood pressure reading in office with diagnosis of hypertension     Discharge Instructions      Take Tylenol as needed for fever or discomfort.  Follow up with your primary care provider.    Your blood pressure is elevated today at 168/63.  Please have this rechecked by your primary care provider in 2-4 weeks.          ED Prescriptions   None    PDMP not reviewed this encounter.   Mickie Bail, NP 05/04/23 1400

## 2023-05-04 NOTE — Discharge Instructions (Addendum)
Take Tylenol as needed for fever or discomfort.  Follow up with your primary care provider.    Your blood pressure is elevated today at 168/63.  Please have this rechecked by your primary care provider in 2-4 weeks.

## 2023-05-26 ENCOUNTER — Encounter: Payer: Self-pay | Admitting: Family

## 2023-05-27 ENCOUNTER — Encounter: Payer: Self-pay | Admitting: Family

## 2023-06-30 ENCOUNTER — Encounter: Payer: Self-pay | Admitting: Family

## 2023-07-21 ENCOUNTER — Encounter: Payer: Self-pay | Admitting: Family

## 2023-09-14 ENCOUNTER — Encounter: Payer: Self-pay | Admitting: Family

## 2023-10-12 ENCOUNTER — Emergency Department: Payer: Medicare HMO

## 2023-10-12 ENCOUNTER — Other Ambulatory Visit: Payer: Self-pay

## 2023-10-12 ENCOUNTER — Emergency Department
Admission: EM | Admit: 2023-10-12 | Discharge: 2023-10-12 | Disposition: A | Discharge status: Home / Self Care | Payer: Medicare HMO | Attending: Emergency Medicine | Admitting: Emergency Medicine

## 2023-10-12 ENCOUNTER — Encounter: Payer: Self-pay | Admitting: Emergency Medicine

## 2023-10-12 DIAGNOSIS — M545 Low back pain, unspecified: Secondary | ICD-10-CM | POA: Insufficient documentation

## 2023-10-12 DIAGNOSIS — I10 Essential (primary) hypertension: Secondary | ICD-10-CM | POA: Insufficient documentation

## 2023-10-12 DIAGNOSIS — R519 Headache, unspecified: Secondary | ICD-10-CM | POA: Diagnosis not present

## 2023-10-12 DIAGNOSIS — F0390 Unspecified dementia without behavioral disturbance: Secondary | ICD-10-CM | POA: Insufficient documentation

## 2023-10-12 LAB — CBC WITH DIFFERENTIAL/PLATELET
Abs Immature Granulocytes: 0.06 10*3/uL (ref 0.00–0.07)
Basophils Absolute: 0 10*3/uL (ref 0.0–0.1)
Basophils Relative: 0 %
Eosinophils Absolute: 0.1 10*3/uL (ref 0.0–0.5)
Eosinophils Relative: 1 %
HCT: 35 % — ABNORMAL LOW (ref 36.0–46.0)
Hemoglobin: 11.8 g/dL — ABNORMAL LOW (ref 12.0–15.0)
Immature Granulocytes: 1 %
Lymphocytes Relative: 8 %
Lymphs Abs: 0.9 10*3/uL (ref 0.7–4.0)
MCH: 32.2 pg (ref 26.0–34.0)
MCHC: 33.7 g/dL (ref 30.0–36.0)
MCV: 95.6 fL (ref 80.0–100.0)
Monocytes Absolute: 0.9 10*3/uL (ref 0.1–1.0)
Monocytes Relative: 8 %
Neutro Abs: 9.4 10*3/uL — ABNORMAL HIGH (ref 1.7–7.7)
Neutrophils Relative %: 82 %
Platelets: 279 10*3/uL (ref 150–400)
RBC: 3.66 MIL/uL — ABNORMAL LOW (ref 3.87–5.11)
RDW: 13.3 % (ref 11.5–15.5)
WBC: 11.3 10*3/uL — ABNORMAL HIGH (ref 4.0–10.5)
nRBC: 0 % (ref 0.0–0.2)

## 2023-10-12 LAB — BASIC METABOLIC PANEL
Anion gap: 9 (ref 5–15)
BUN: 20 mg/dL (ref 8–23)
CO2: 24 mmol/L (ref 22–32)
Calcium: 8.5 mg/dL — ABNORMAL LOW (ref 8.9–10.3)
Chloride: 99 mmol/L (ref 98–111)
Creatinine, Ser: 0.91 mg/dL (ref 0.44–1.00)
GFR, Estimated: >60 mL/min (ref >60)
Glucose, Bld: 122 mg/dL — ABNORMAL HIGH (ref 70–99)
Potassium: 4.2 mmol/L (ref 3.5–5.1)
Sodium: 132 mmol/L — ABNORMAL LOW (ref 135–145)

## 2023-10-12 MED ORDER — KETOROLAC TROMETHAMINE 10 MG PO TABS: 10.0000 mg | ORAL_TABLET | Freq: Four times a day (QID) | ORAL | 0 refills | Status: AC | PRN

## 2023-10-12 MED ORDER — KETOROLAC TROMETHAMINE 30 MG/ML IJ SOLN
30.0000 mg | Freq: Once | INTRAMUSCULAR | Status: AC
  Administered 2023-10-12: 30 mg via INTRAMUSCULAR
  Filled 2023-10-12: qty 1

## 2023-10-12 NOTE — Discharge Instructions (Signed)
As we discussed please use your Toradol every 6 hours as needed for headache/discomfort.  Do not use ibuprofen/Aleve/Motrin while you are using this medication.  Please follow-up with your doctor regarding your continued headaches.  Return to the emergency department for any worsening symptoms any weakness numbness, difficulty speaking or any other symptom personally concerning to yourself.

## 2023-10-12 NOTE — ED Triage Notes (Signed)
Patient to ED via POV for headaches x4 days. Family reports that she has also been taking a muscle relaxant for her back pain and is concerned that it is coming from that. Stopped taking medication 2 days ago. Currently has fentanyl patch on belly and took Advil PTA. Hx of dementia

## 2023-10-12 NOTE — ED Provider Notes (Signed)
[facility] Provider Note    Event Date/Time   First MD Initiated Contact with Patient 10/12/23 [phone]     (approximate)  History   Chief Complaint: Headache  HPI  Veronica Meza is a 77 y.o. female with a past medical history of hypertension, dementia, presents to the emergency department for 5 days of worsening headache as well as lower back pain.  According to the son she has a history of dementia she has been experiencing pain in her back and in her muscles.  5 days ago her doctor put her on a muscle relaxer in addition to her fentanyl patches.  Son states in addition to the muscle relaxers they are having to give her ibuprofen every 6 hours or so for the pain which has been worsening.  He is not sure if she may have a fracture, states a history of fractures without trauma in the past.  States she has never really complained of headaches prior to this.  Patient is describing more of an occipital headache bilaterally.  With her history of dementia she is not able to give a great history or provide exact locations of where the pain originates.  Has been able to ambulate per son although with discomfort.  He also states the headache seems to coincide with the starting of the muscle relaxer.  Physical Exam   Triage Vital Signs: ED Triage Vitals  Encounter Vitals Group     BP 10/12/23 0759 (!) 146/101     Systolic BP Percentile --      Diastolic BP Percentile --      Pulse Rate 10/12/23 0759 79     Resp 10/12/23 0759 18     Temp 10/12/23 0759 98.4 F (36.9 C)     Temp Source 10/12/23 0759 Oral     SpO2 10/12/23 0759 99 %     Weight 10/12/23 0800 114 lb 10.2 oz (52 kg)     Height 10/12/23 0800 5\' 2"  (1.575 m)     Head Circumference --      Peak Flow --      Pain Score --      Pain Loc --      Pain Education --      Exclude from Growth Chart --     Most recent vital signs: Vitals:   10/12/23 0759  BP: (!) 146/101  Pulse: 79  Resp: 18  Temp: 98.4 F  (36.9 C)  SpO2: 99%    General: Awake, no distress.  CV:  Good peripheral perfusion.  Regular rate and rhythm  Resp:  Normal effort.  Equal breath sounds bilaterally.  Abd:  No distention.  Soft, nontender.  No rebound or guarding. Other:  No C-spine or T-spine tenderness.  Upper L-spine mild tenderness, no step-offs or deformities.  Good range of motion in all extremities.   ED Results / Procedures / Treatments   RADIOLOGY  I have reviewed and interpreted CT head images.  No bleed seen on my evaluation. Radiology has read the CT scan of the head is negative for acute abnormality. CT C-spine negative for acute abnormality. CT T-spine shows a mild T11 compression fracture which is chronic mild spinal stenosis. CT L-spine shows no acute osseous abnormality there is a chronic L1 compression fracture   MEDICATIONS ORDERED IN ED: Medications - No data to display   IMPRESSION / MDM / ASSESSMENT AND PLAN / ED COURSE  I reviewed the triage vital signs and the  nursing notes.  Patient's presentation is most consistent with acute presentation with potential threat to life or bodily function.  Patient presents the emergency department for headache and back pain.  Son states a history of nontraumatic fractures in the past is worried that she may have a new compression fracture in the back.  However given her dementia it is very difficult to get exact locations of where she is hurting.  She is also complaining of a headache x 5 days which coincides with the starting of the muscle relaxer per son.  But no real history of headaches prior to that.  Given the new onset of headaches we will obtain CT imaging of the head as a precaution.  I have evaluated the area there is no rash or other concerning findings.  Will also obtain CT imaging of the CT and L-spine to further evaluate given her history of back pain with nontraumatic fractures in the past and dementia.  Son and patient agreeable to plan of  care.  We will dose IM Toradol and evaluate for symptom relief.  CT scans essentially negative for acute abnormality there are 2 chronic areas of compression fracture.  Patient is feeling better after Toradol.  Discussed with the son a prescription for Toradol instead of ibuprofen at home.  He is agreeable to plan will follow-up with her doctor.  FINAL CLINICAL IMPRESSION(S) / ED DIAGNOSES   Headache Back pain   Note:  This document was prepared using Dragon voice recognition software and may include unintentional dictation errors.   Minna Antis, MD 10/12/23 [phone]

## 2023-10-14 ENCOUNTER — Encounter: Payer: Self-pay | Admitting: Emergency Medicine

## 2023-10-14 ENCOUNTER — Emergency Department
Admission: EM | Admit: 2023-10-14 | Discharge: 2023-10-14 | Disposition: A | Discharge status: Home / Self Care | Payer: Medicare HMO | Attending: Student in an Organized Health Care Education/Training Program | Admitting: Student in an Organized Health Care Education/Training Program

## 2023-10-14 ENCOUNTER — Other Ambulatory Visit: Payer: Self-pay

## 2023-10-14 ENCOUNTER — Emergency Department: Payer: Medicare HMO

## 2023-10-14 DIAGNOSIS — Z20822 Contact with and (suspected) exposure to covid-19: Secondary | ICD-10-CM | POA: Diagnosis not present

## 2023-10-14 DIAGNOSIS — K529 Noninfective gastroenteritis and colitis, unspecified: Secondary | ICD-10-CM | POA: Insufficient documentation

## 2023-10-14 DIAGNOSIS — F0390 Unspecified dementia without behavioral disturbance: Secondary | ICD-10-CM | POA: Insufficient documentation

## 2023-10-14 DIAGNOSIS — R509 Fever, unspecified: Secondary | ICD-10-CM | POA: Diagnosis present

## 2023-10-14 DIAGNOSIS — R519 Headache, unspecified: Secondary | ICD-10-CM | POA: Insufficient documentation

## 2023-10-14 LAB — CBC WITH DIFFERENTIAL/PLATELET
Abs Immature Granulocytes: 0.25 10*3/uL — ABNORMAL HIGH (ref 0.00–0.07)
Basophils Absolute: 0 10*3/uL (ref 0.0–0.1)
Basophils Relative: 0 %
Eosinophils Absolute: 0 10*3/uL (ref 0.0–0.5)
Eosinophils Relative: 0 %
HCT: 31.5 % — ABNORMAL LOW (ref 36.0–46.0)
Hemoglobin: 10.6 g/dL — ABNORMAL LOW (ref 12.0–15.0)
Immature Granulocytes: 1 %
Lymphocytes Relative: 3 %
Lymphs Abs: 0.7 10*3/uL (ref 0.7–4.0)
MCH: 32.3 pg (ref 26.0–34.0)
MCHC: 33.7 g/dL (ref 30.0–36.0)
MCV: 96 fL (ref 80.0–100.0)
Monocytes Absolute: 1.8 10*3/uL — ABNORMAL HIGH (ref 0.1–1.0)
Monocytes Relative: 8 %
Neutro Abs: 18.9 10*3/uL — ABNORMAL HIGH (ref 1.7–7.7)
Neutrophils Relative %: 88 %
Platelets: 269 10*3/uL (ref 150–400)
RBC: 3.28 MIL/uL — ABNORMAL LOW (ref 3.87–5.11)
RDW: 13.3 % (ref 11.5–15.5)
WBC: 21.7 10*3/uL — ABNORMAL HIGH (ref 4.0–10.5)
nRBC: 0 % (ref 0.0–0.2)

## 2023-10-14 LAB — LACTIC ACID, PLASMA: Lactic Acid, Venous: 1.5 mmol/L (ref 0.5–1.9)

## 2023-10-14 LAB — BASIC METABOLIC PANEL
Anion gap: 9 (ref 5–15)
BUN: 30 mg/dL — ABNORMAL HIGH (ref 8–23)
CO2: 22 mmol/L (ref 22–32)
Calcium: 8.2 mg/dL — ABNORMAL LOW (ref 8.9–10.3)
Chloride: 100 mmol/L (ref 98–111)
Creatinine, Ser: 0.94 mg/dL (ref 0.44–1.00)
GFR, Estimated: >60 mL/min (ref >60)
Glucose, Bld: 120 mg/dL — ABNORMAL HIGH (ref 70–99)
Potassium: 4 mmol/L (ref 3.5–5.1)
Sodium: 131 mmol/L — ABNORMAL LOW (ref 135–145)

## 2023-10-14 LAB — SARS CORONAVIRUS 2 BY RT PCR: SARS Coronavirus 2 by RT PCR: NEGATIVE

## 2023-10-14 LAB — URINALYSIS, ROUTINE W REFLEX MICROSCOPIC
Bilirubin Urine: NEGATIVE
Glucose, UA: NEGATIVE mg/dL
Hgb urine dipstick: NEGATIVE
Ketones, ur: NEGATIVE mg/dL
Leukocytes,Ua: NEGATIVE
Nitrite: NEGATIVE
Protein, ur: NEGATIVE mg/dL
Specific Gravity, Urine: 1.02 (ref 1.005–1.030)
pH: 5 (ref 5.0–8.0)

## 2023-10-14 LAB — HEPATIC FUNCTION PANEL
ALT: 12 U/L (ref 0–44)
AST: 24 U/L (ref 15–41)
Albumin: 2.6 g/dL — ABNORMAL LOW (ref 3.5–5.0)
Alkaline Phosphatase: 58 U/L (ref 38–126)
Bilirubin, Direct: 0.2 mg/dL (ref 0.0–0.2)
Indirect Bilirubin: 0.6 mg/dL (ref 0.3–0.9)
Total Bilirubin: 0.8 mg/dL (ref <1.2)
Total Protein: 6.2 g/dL — ABNORMAL LOW (ref 6.5–8.1)

## 2023-10-14 LAB — PROCALCITONIN: Procalcitonin: 0.19 ng/mL

## 2023-10-14 MED ORDER — IOHEXOL 300 MG/ML  SOLN
100.0000 mL | Freq: Once | INTRAMUSCULAR | Status: AC | PRN
  Administered 2023-10-14: 75 mL via INTRAVENOUS

## 2023-10-14 MED ORDER — ONDANSETRON 4 MG PO TBDP: 4.0000 mg | ORAL_TABLET | Freq: Three times a day (TID) | ORAL | 0 refills | Status: AC | PRN

## 2023-10-14 MED ORDER — AMOXICILLIN-POT CLAVULANATE 875-125 MG PO TABS
1.0000 | ORAL_TABLET | Freq: Once | ORAL | Status: AC
  Administered 2023-10-14: 1 via ORAL
  Filled 2023-10-14: qty 1

## 2023-10-14 MED ORDER — SODIUM CHLORIDE 0.9 % IV BOLUS
500.0000 mL | Freq: Once | INTRAVENOUS | Status: AC
  Administered 2023-10-14: 500 mL via INTRAVENOUS

## 2023-10-14 MED ORDER — AMOXICILLIN-POT CLAVULANATE ER 1000-62.5 MG PO TB12: 2.0000 | ORAL_TABLET | Freq: Two times a day (BID) | ORAL | 0 refills | Status: AC

## 2023-10-14 MED ORDER — PROBIOTIC 250 MG PO CAPS: 1.0000 | ORAL_CAPSULE | Freq: Two times a day (BID) | ORAL | 0 refills | Status: AC

## 2023-10-14 NOTE — ED Notes (Signed)
In and out performed with [facility] NT

## 2023-10-14 NOTE — ED Provider Notes (Signed)
[facility] Provider Note    Event Date/Time   First MD Initiated Contact with Patient 10/14/23 [phone]     (approximate)   History   Fever and Headache   HPI  Veronica Meza is a 77 y.o. female history of dementia presents to the ER for evaluation of headache dysuria.  Seen 2 days ago for similar symptoms.  Workup was reassuring.  Went home and over the following 24 hours did develop fever worsening dysuria.  Imaging was reassuring.  Denies any abdominal pain nausea or vomiting.     Physical Exam   Triage Vital Signs: ED Triage Vitals  Encounter Vitals Group     BP 10/14/23 0749 (!) 106/45     Systolic BP Percentile --      Diastolic BP Percentile --      Pulse Rate 10/14/23 0749 77     Resp 10/14/23 0749 16     Temp 10/14/23 0749 98.6 F (37 C)     Temp Source 10/14/23 0749 Oral     SpO2 10/14/23 0749 95 %     Weight 10/14/23 0748 100 lb (45.4 kg)     Height 10/14/23 0748 5\' 4"  (1.626 m)     Head Circumference --      Peak Flow --      Pain Score --      Pain Loc --      Pain Education --      Exclude from Growth Chart --     Most recent vital signs: Vitals:   10/14/23 1200 10/14/23 1211  BP: (!) 123/49   Pulse: 62   Resp: 16   Temp:  98 F (36.7 C)  SpO2: 97%      Constitutional: Alert  Eyes: Conjunctivae are normal.  Head: Atraumatic. Nose: No congestion/rhinnorhea. Mouth/Throat: Mucous membranes are moist.   Neck: Painless ROM.  Cardiovascular:   Good peripheral circulation. Respiratory: Normal respiratory effort.  No retractions.  Gastrointestinal: Soft and nontender.  Musculoskeletal:  no deformity Neurologic:  MAE spontaneously. No gross focal neurologic deficits are appreciated.  Skin:  Skin is warm, dry and intact. No rash noted. Psychiatric: Mood and affect are normal. Speech and behavior are normal.    ED Results / Procedures / Treatments   Labs (all labs ordered are listed, but only abnormal results are  displayed) Labs Reviewed  URINALYSIS, ROUTINE W REFLEX MICROSCOPIC - Abnormal; Notable for the following components:      Result Value   Color, Urine AMBER (*)    APPearance HAZY (*)    All other components within normal limits  CBC WITH DIFFERENTIAL/PLATELET - Abnormal; Notable for the following components:   WBC 21.7 (*)    RBC 3.28 (*)    Hemoglobin 10.6 (*)    HCT 31.5 (*)    Neutro Abs 18.9 (*)    Monocytes Absolute 1.8 (*)    Abs Immature Granulocytes 0.25 (*)    All other components within normal limits  BASIC METABOLIC PANEL - Abnormal; Notable for the following components:   Sodium 131 (*)    Glucose, Bld 120 (*)    BUN 30 (*)    Calcium 8.2 (*)    All other components within normal limits  HEPATIC FUNCTION PANEL - Abnormal; Notable for the following components:   Total Protein 6.2 (*)    Albumin 2.6 (*)    All other components within normal limits  SARS CORONAVIRUS 2 BY RT PCR  LACTIC  ACID, PLASMA  PROCALCITONIN  LACTIC ACID, PLASMA     EKG     RADIOLOGY Please see ED Course for my review and interpretation.  I personally reviewed all radiographic images ordered to evaluate for the above acute complaints and reviewed radiology reports and findings.  These findings were personally discussed with the patient.  Please see medical record for radiology report.    PROCEDURES:  Critical Care performed: No  Procedures   MEDICATIONS ORDERED IN ED: Medications  sodium chloride 0.9 % bolus 500 mL (0 mLs Intravenous Stopped 10/14/23 1027)  iohexol (OMNIPAQUE) 300 MG/ML solution 100 mL (75 mLs Intravenous Contrast Given 10/14/23 0924)  amoxicillin-clavulanate (AUGMENTIN) 875-125 MG per tablet 1 tablet (1 tablet Oral Given 10/14/23 1144)  sodium chloride 0.9 % bolus 500 mL (500 mLs Intravenous New Bag/Given 10/14/23 1158)     IMPRESSION / MDM / ASSESSMENT AND PLAN / ED COURSE  I reviewed the triage vital signs and the nursing notes.                               Differential diagnosis includes, but is not limited to, pyelonephritis, UTI, colitis, appendicitis, migraine, tension, meningitis, URI, pneumonia, encephalitis, withdrawal  Patient presenting to the ER for evaluation of symptoms as described above.  Based on symptoms, risk factors and considered above differential, this presenting complaint could reflect a potentially life-threatening illness therefore the patient will be placed on continuous pulse oximetry and telemetry for monitoring.  Laboratory evaluation will be sent to evaluate for the above complaints.  Patient with extensive past medical history dementia previously on hospice history of cirrhosis with ascites presenting with symptoms as described above.  Does not appear acutely meningitic.  Blood will be sent for the but differential.  She is not febrile here.  Not tachycardic does not appear septic.  Will order blood work to evaluate for sepsis infectious process.   Clinical Course as of 10/14/23 1230  Thu Oct 14, 2023  0919 COVID-negative.  Significant white count.  UA without signs of infection.  Have ordered IV fluids.  Will order CT. [PR]  1140 Lactic Acid, Venous: 1.5 [PR]  1141 Discussed results CT imaging and workup with patient and son at bedside.  Do suspect some colitis.  No sign of UTI.  Has had some loose diarrheal stool over the past few days no hematochezia.  Given white count have recommended antibiotics.  We discussed option for admission to the hospital for IV fluid and IV antibiotics but son is hesitant to do this as she has significant worsening of her dementia during hospitalizations that she struggles to recover from.  Given this history will p.o. challenge and try p.o. antibiotic and if able to tolerate may be an option for outpatient follow-up. [PR]  1228 Patient tolerating p.o.  We had some lengthy discussion about her workup and findings thus far.  Son was concerned about headaches and back and neck discomfort.  Had  lengthy discussion regarding possibility of meningitis and encephalitis though I have a lower suspicion for this given other explanations for her symptoms and on questioning the patient she denies any headache and does endorse abdominal pain consistent with colitis.  Given her white count we did discuss consideration of lumbar puncture but the son in discussion with his sister would like to hold off on this procedure at this time after discussing the risks and benefits of this procedure when we have other  explanations for the patient's presentation.  Again discussed observation in the hospital some prefer outpatient management lives close by and would agree to return if symptoms worsen at any point. [PR]    Clinical Course User Index [PR] Willy Eddy, MD     FINAL CLINICAL IMPRESSION(S) / ED DIAGNOSES   Final diagnoses:  Colitis     Rx / DC Orders   ED Discharge Orders          Ordered    amoxicillin-clavulanate (AUGMENTIN XR) 1000-62.5 MG 12 hr tablet  2 times daily        10/14/23 1201    ondansetron (ZOFRAN-ODT) 4 MG disintegrating tablet  Every 8 hours PRN        10/14/23 1201    Saccharomyces boulardii (PROBIOTIC) 250 MG CAPS  2 times daily        10/14/23 1201             Note:  This document was prepared using Dragon voice recognition software and may include unintentional dictation errors.    Willy Eddy, MD 10/14/23 1231

## 2023-10-14 NOTE — ED Notes (Signed)
Pt unsuccessful at providing urine sample

## 2023-10-14 NOTE — ED Triage Notes (Signed)
Pt via POV from home. Pt was seen here 2 days ago for headaches. Pt was discharged. Now complaining of painful urination, posterior headache, and fever. Report a fever of 101.8 this AM. Pt took a dose of Toradol at 0700. Denies any cough. Pt is alert but disoriented x4, pt has a hx of dementia per son.

## 2023-10-14 NOTE — Discharge Instructions (Signed)
You have been seen in the emergency department for emergency care. It is important that you contact your own doctor, specialist or the closest clinic for follow-up care. Please bring this instruction sheet, all medications and X-ray copies with you when you are seen for follow-up care. ° °Determining the exact cause for all patients with abdominal pain is extremely difficult in the emergency department. Our primary focus is to rule-out immediate life-threatening diseases. If no immediate source of pain is found the definitive diagnosis frequently needs to be determined over time.Many times your primary care physician can determine the cause by following the symptoms over time. Sometimes, specialist are required such as Gastroenterologists, Gynecologists, Urologists or Surgeons. Please return °immediately to the Emergency Department for fever>101, Vomiting or Intractable Pain. You should return to the emergency department or see your primary care provider in 12-24hrs if your pain is no better and °sooner if your pain becomes worse. ° °

## 2023-10-25 ENCOUNTER — Emergency Department
Admission: EM | Admit: 2023-10-25 | Discharge: 2023-10-25 | Discharge status: Against Medical Advice | Payer: Medicare HMO | Attending: Emergency Medicine | Admitting: Emergency Medicine

## 2023-10-25 ENCOUNTER — Other Ambulatory Visit: Payer: Self-pay

## 2023-10-25 DIAGNOSIS — Z5321 Procedure and treatment not carried out due to patient leaving prior to being seen by health care provider: Secondary | ICD-10-CM | POA: Diagnosis not present

## 2023-10-25 DIAGNOSIS — R519 Headache, unspecified: Secondary | ICD-10-CM | POA: Diagnosis present

## 2023-10-25 DIAGNOSIS — R531 Weakness: Secondary | ICD-10-CM | POA: Diagnosis not present

## 2023-10-25 NOTE — ED Triage Notes (Signed)
Pt reports posterior headache over the last several weeks. Pt reports headache is constant but gets some relief with ibuprofen. Pt reports headache has been worse tonight. Pt denies vision changes or focal weakness. Pt reports ongoing generalized weakness.

## 2023-10-28 ENCOUNTER — Ambulatory Visit: Payer: Medicare HMO | Admitting: Gastroenterology

## 2023-10-28 ENCOUNTER — Encounter: Payer: Self-pay | Admitting: Gastroenterology

## 2023-10-28 VITALS — BP 133/65 | HR 76 | Temp 97.3°F | Wt 96.0 lb

## 2023-10-28 DIAGNOSIS — E8801 Alpha-1-antitrypsin deficiency: Secondary | ICD-10-CM | POA: Diagnosis not present

## 2023-10-28 DIAGNOSIS — K7460 Unspecified cirrhosis of liver: Secondary | ICD-10-CM

## 2023-10-28 DIAGNOSIS — F0280 Dementia in other diseases classified elsewhere without behavioral disturbance: Secondary | ICD-10-CM

## 2023-10-28 DIAGNOSIS — F0390 Unspecified dementia without behavioral disturbance: Secondary | ICD-10-CM | POA: Diagnosis not present

## 2023-10-28 DIAGNOSIS — R531 Weakness: Secondary | ICD-10-CM

## 2023-10-28 NOTE — Progress Notes (Signed)
Gastroenterology Consultation  Referring Provider:     Felton Clinton, MD Primary Care Physician:  Margarita Mail, DO Primary Gastroenterologist:  Dr. Servando Snare     Reason for Consultation:     Ascites and alpha-1 antitrypsin deficiency        HPI:   Veronica Meza is a 77 y.o. y/o female referred for consultation & management of ascites and alpha 1 antitrypsin deficiency by Dr. Margarita Mail, DO.  This patient comes in today after being seen in the past at [facility] with a history of alpha-1 antitrypsin ACE deficiency.  The patient was in the emergency room recently at the beginning this month with imaging that showed:  MPRESSION: 1. Wall thickening and inflammatory changes of the ascending and proximal transverse colon, compatible with a nonspecific colitis. 2. 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 Trace abdominopelvic ascites. 3. Cholelithiasis without evidence of acute cholecystitis. 4.  Aortic Atherosclerosis (ICD10-I70.0).  The patient had been in the emergency department for headaches and dysuria. The patient is reported by family to be more tired recently.  She has also been followed by her primary care provider who recently put her on iron supplementation in addition to the patient taking NSAIDs for her pain.  The family seems very unaware of what is good or bad for the patient and is now coming to me today for some clarification.  There is also report that the patient has a history of hemochromatosis and has had treatment with phlebotomy in the past.  Past Medical History:  Diagnosis Date   AAT (alpha-1-antitrypsin) deficiency (HCC) 01/13/2017   Hypertension    Memory loss     Past Surgical History:  Procedure Laterality Date   APPENDECTOMY     ELBOW SURGERY Right    PORT WINE STAIN REMOVAL W/ LASER      Prior to Admission medications   Medication Sig Start Date End Date Taking? Authorizing Provider  atorvastatin (LIPITOR) 40 MG  tablet Take 40 mg by mouth daily.    [provider]  busPIRone (BUSPAR) 10 MG tablet Take 10 mg by mouth 2 (two) times daily as needed.     [provider]  CHEST CONGESTION RELIEF 100 MG/5ML liquid Take by mouth. 03/11/23   [provider]  Cholecalciferol (VITAMIN D3) 125 MCG (5000 UT) TABS Take by mouth.    [provider]  fentaNYL (DURAGESIC) 12 MCG/HR PLEASE SEE ATTACHED FOR DETAILED DIRECTIONS    [provider]  gabapentin (NEURONTIN) 300 MG capsule Take 300 mg by mouth 3 (three) times daily. 03/26/19   [provider]  hydrALAZINE (APRESOLINE) 25 MG tablet Take 50 mg by mouth 2 (two) times daily.    [provider]  hydrochlorothiazide (HYDRODIURIL) 25 MG tablet Take 1 tablet (25 mg total) by mouth daily. 10/12/19   Dianne Dun, MD  ketorolac (TORADOL) 10 MG tablet Take 1 tablet (10 mg total) by mouth every 6 (six) hours as needed. 10/12/23   Minna Antis, MD  levothyroxine (SYNTHROID) 75 MCG tablet Take 75 mcg by mouth daily. 02/13/19   [provider]  levothyroxine (SYNTHROID) 88 MCG tablet Take by mouth. 01/19/23   [provider]  lisinopril (ZESTRIL) 40 MG tablet Take 40 mg by mouth daily. 02/13/19   [provider]  lisinopril (ZESTRIL) 40 MG tablet Take 1 tablet by mouth daily. 10/07/22   [provider]  loratadine (CLARITIN) 10 MG tablet Take 10 mg by mouth.  [provider]  LORazepam (ATIVAN) 0.5 MG tablet Take 1/2 tab (0.25 mg) to 1 tablet (0.5 mg) every 8 hours as needed for anxiety. 07/20/22   [provider]  Mag Aspart-Potassium Aspart (POTASSIUM & MAGNESIUM ASPARTAT PO) Take 1 tablet by mouth daily.    [provider]  MELATONIN PLUS L-THEANINE 10-5.5 MG TABS Take 1 tablet by mouth at bedtime. 04/08/23   [provider]  memantine (NAMENDA) 10 MG tablet TAKE 1 TABLET BY MOUTH TWICE A DAY 01/02/20   Levert Feinstein, MD  mirtazapine (REMERON) 15 MG  tablet Take by mouth. 07/10/22   [provider]  ondansetron (ZOFRAN-ODT) 4 MG disintegrating tablet Take 1 tablet (4 mg total) by mouth every 8 (eight) hours as needed for nausea or vomiting. 10/14/23   Willy Eddy, MD  Saccharomyces boulardii (PROBIOTIC) 250 MG CAPS Take 1 capsule by mouth in the morning and at bedtime. 10/14/23   Willy Eddy, MD  SENEXON-S 8.6-50 MG tablet SMARTSIG:1-4 Tablet(s) By Mouth Twice Daily PRN 04/08/23   [provider]  sertraline (ZOLOFT) 25 MG tablet Take by mouth. 01/19/23   [provider]  spironolactone (ALDACTONE) 25 MG tablet Take by mouth. 01/07/23   [provider]    Family History  Problem Relation Age of Onset   Stroke Mother    Heart disease Father      Social History   Tobacco Use   Smoking status: Former    Current packs/day: 0.00    Average packs/day: 1 pack/day for 15.0 years (15.0 ttl pk-yrs)    Types: Cigarettes    Start date: 01/13/1995    Quit date: 01/13/2010    Years since quitting: 13.7   Smokeless tobacco: Never  Vaping Use   Vaping status: Never Used  Substance Use Topics   Alcohol use: No   Drug use: No    Allergies as of 10/28/2023 - Review Complete 10/28/2023  Allergen Reaction Noted   Alendronate Nausea Only 06/18/2016   Codeine Other (See Comments) 11/01/2015   Hydrocodone-acetaminophen Swelling 01/08/2016   Erythromycin base Nausea Only 01/08/2016   Sulfa antibiotics Itching and Nausea And Vomiting 11/01/2015    Review of Systems:    All systems reviewed and negative except where noted in HPI.   Physical Exam:  BP 133/65 (BP Location: Right Arm, Patient Position: Sitting, Cuff Size: Normal)   Pulse 76   Temp (!) 97.3 F (36.3 C) (Oral)   Wt 96 lb (43.5 kg)   BMI 16.48 kg/m  No LMP recorded. Patient has had a hysterectomy. General:   Alert,  Well-developed, well-nourished, pleasant and cooperative in NAD Head:  Normocephalic and atraumatic. Eyes:  Sclera clear, no  icterus.   Conjunctiva pink. Ears:  Normal auditory acuity. Rectal:  Deferred.  Extremities:  No clubbing or edema.  No cyanosis. Neurologic:  Alert ;  grossly normal neurologically. Psych:  Alert and cooperative but confused. Normal mood and affect.  Imaging Studies: CT ABDOMEN PELVIS W CONTRAST  Result Date: 10/14/2023 CLINICAL DATA:  Painful urination.  Fever. EXAM: CT ABDOMEN AND PELVIS WITH CONTRAST TECHNIQUE: Multidetector CT imaging of the abdomen and pelvis was performed using the standard protocol following bolus administration of intravenous contrast. RADIATION DOSE REDUCTION: This exam was performed according to the departmental dose-optimization program which includes automated exposure control, adjustment of the mA and/or kV according to patient size and/or use of iterative reconstruction technique. CONTRAST:  75mL OMNIPAQUE IOHEXOL 300 MG/ML  SOLN COMPARISON:  CT abdomen/pelvis dated  October 01, 2022. FINDINGS: Lower chest: No acute abnormality. Hepatobiliary: Cirrhotic morphology of the liver with contour nodularity. Hepatic steatosis. No suspicious focal lesion. Cholelithiasis. No gallbladder wall thickening or pericholecystic fluid. No biliary dilation. Pancreas: Unremarkable. No pancreatic ductal dilatation or surrounding inflammatory changes. Spleen: Normal in size without focal abnormality. Adrenals/Urinary Tract: Adrenal glands are unremarkable. The kidneys enhance symmetrically. No suspicious focal lesion. No renal calculi or hydronephrosis. Bladder is unremarkable. Stomach/Bowel: Stomach is within normal limits. Status post appendectomy. Wall thickening with edema, mucosal hyperenhancement, and surrounding pericolonic fat stranding of the ascending and proximal transverse colon. Descending and sigmoid colonic diverticulosis without evidence of acute diverticulitis. Vascular/Lymphatic: Aortic atherosclerosis. Gastroesophageal and left upper quadrant varices are noted. No enlarged  abdominal or pelvic lymph nodes. Reproductive: Status post hysterectomy. No adnexal masses. Other: Trace pelvic free fluid.  No intraperitoneal free air. Musculoskeletal: No acute osseous abnormality. Stable T11 compression deformity. No suspicious osseous lesion. IMPRESSION: 1. Wall thickening and inflammatory changes of the ascending and proximal transverse colon, compatible with a nonspecific colitis. 2. 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 Trace abdominopelvic ascites. 3. Cholelithiasis without evidence of acute cholecystitis. 4.  Aortic Atherosclerosis (ICD10-I70.0). Electronically Signed   By: Hart Robinsons M.D.   On: 10/14/2023 10:46   DG Chest 2 View  Result Date: 10/14/2023 CLINICAL DATA:  fever, eval for pna EXAM: CHEST - 2 VIEW COMPARISON:  10/01/2022. FINDINGS: Bilateral lung Schweiss are clear. Bilateral costophrenic angles are clear. Normal cardio-mediastinal silhouette. No acute osseous abnormalities. The soft tissues are within normal limits. IMPRESSION: *No active cardiopulmonary disease. Electronically Signed   By: Jules Schick M.D.   On: 10/14/2023 10:01   CT Lumbar Spine Wo Contrast  Result Date: 10/12/2023 CLINICAL DATA:  77 year old female with headache for 4 days, neck and back pain. EXAM: CT LUMBAR SPINE WITHOUT CONTRAST TECHNIQUE: Multidetector CT imaging of the lumbar spine was performed without intravenous contrast administration. Multiplanar CT image reconstructions were also generated. RADIATION DOSE REDUCTION: This exam was performed according to the departmental dose-optimization program which includes automated exposure control, adjustment of the mA and/or kV according to patient size and/or use of iterative reconstruction technique. COMPARISON:  Thoracic spine CT today. CT Abdomen and Pelvis 10/01/2022. FINDINGS: Segmentation: Normal, concordant with the thoracic numbering today. Alignment: Stable lumbar lordosis from the CT last year. Minimal  to mild levoconvex lumbar scoliosis. Vertebrae: Osteopenia. Mild chronic L1 superior endplate compression is stable. L2 benign vertebral body hemangioma. S2-S3 sacral Tarlov cyst (normal variant). Sacral ala and SI joints appear intact. No acute osseous abnormality identified. Paraspinal and other soft tissues: Aortoiliac calcified atherosclerosis. Partially visible nodular liver contour, cholelithiasis. Regression of ascites since last year, small volume residual in the visible pelvis. Mild chronic appearing right colonic wall thickening such as on series 6, image 83. Negative lumbar paraspinal soft tissues. Disc levels: Mild for age lumbar spine degeneration and capacious lumbar spinal canal. No CT evidence of lumbar spinal stenosis. No significant lumbar foraminal stenosis suspected. IMPRESSION: 1. No acute osseous abnormality in the Lumbar Spine. Mild chronic L1 compression fracture. Osteopenia. 2. Mild for age lumbar spine degeneration, with no evidence of spinal stenosis. 3. Partially visible cirrhosis, cholelithiasis. Regressed ascites since last year. 4.  Aortic Atherosclerosis (ICD10-I70.0). Electronically Signed   By: Odessa Fleming M.D.   On: 10/12/2023 09:47   CT Thoracic Spine Wo Contrast  Result Date: 10/12/2023 CLINICAL DATA:  77 year old female with headache for 4 days, neck and back pain. EXAM: CT THORACIC SPINE  WITHOUT CONTRAST TECHNIQUE: Multidetector CT images of the thoracic were obtained using the standard protocol without intravenous contrast. RADIATION DOSE REDUCTION: This exam was performed according to the departmental dose-optimization program which includes automated exposure control, adjustment of the mA and/or kV according to patient size and/or use of iterative reconstruction technique. COMPARISON:  Cervical spine CT today.  Chest radiograph 05/22/2019. CT Abdomen and Pelvis 10/01/2022. FINDINGS: Limited cervical spine imaging: Reported separately today. However, addition of small  cervical ribs at C7 noted. Thoracic spine segmentation: Normal, C7 cervical ribs (normal variant). Alignment: Stable thoracic kyphosis since 2020, mildly exaggerated. No significant spondylolisthesis. Vertebrae: Osteopenia. Mild chronic T11 superior endplate compression appears stable since 2020. Maintained thoracic vertebral height otherwise. Lumbar spine detailed separately today. Evidence of degenerative developing interbody ankylosis at both T5-T6 and T6-T7. No other ankylosis. No acute osseous abnormality identified. Posterior ribs appear grossly intact. Paraspinal and other soft tissues: Centrilobular emphysema. Calcified aortic atherosclerosis. Visible major airways and lungs well aerated. Calcified medial basal segment left lower lobe granuloma (benign). Calcified coronary artery atherosclerosis is evident on series 5, image 79. No cardiomegaly. Small gastric hiatal hernia. No pericardial or pleural effusion. Abdomen is detailed separately. Thoracic paraspinal soft tissues remain within normal limits. Disc levels: Age appropriate and/or mild for age thoracic spine degeneration with capacious thoracic spinal canal at most levels. Exception: At T7-T8 there is a right paracentral disc protrusion which is moderate (series 5, image 77). Mild spinal stenosis suspected there. No foraminal involvement. Mild retropulsion of the T11 posterosuperior endplate without significant stenosis. IMPRESSION: 1. Chronic mild T11 compression fracture. Osteopenia. No acute osseous abnormality in the Thoracic Spine. 2. Generally mild for age thoracic spine degeneration, however, right paracentral disc herniation at T7-T8 with suspected mild spinal stenosis there. 3. Aortic Atherosclerosis (ICD10-I70.0) and Emphysema (ICD10-J43.9). Electronically Signed   By: Odessa Fleming M.D.   On: 10/12/2023 09:44   CT Cervical Spine Wo Contrast  Result Date: 10/12/2023 CLINICAL DATA:  77 year old female with headache for 4 days, neck and back  pain. EXAM: CT CERVICAL SPINE WITHOUT CONTRAST TECHNIQUE: Multidetector CT imaging of the cervical spine was performed without intravenous contrast. Multiplanar CT image reconstructions were also generated. RADIATION DOSE REDUCTION: This exam was performed according to the departmental dose-optimization program which includes automated exposure control, adjustment of the mA and/or kV according to patient size and/or use of iterative reconstruction technique. COMPARISON:  Head CT today.  Brain MRI 10/31/2018. FINDINGS: Alignment: Straightening of cervical lordosis. Incompletely visible cervicothoracic junction but alignment there appears maintained. Bilateral posterior element alignment is within normal limits. Skull base and vertebrae: Isolated small round sclerotic probable benign bone island in the right C1 ring series 3, image 27. Elsewhere normal for age to mildly osteopenic bone mineralization. Visualized skull base is intact. No atlanto-occipital dissociation. C1 and C2 appear intact and aligned. No acute osseous abnormality identified. Soft tissues and spinal canal: No prevertebral fluid or swelling. No visible canal hematoma. Moderate to bulky calcified bilateral cervical carotid atherosclerosis. Otherwise negative visible noncontrast neck soft tissues. Disc levels: Mild for age cervical spine degeneration, chiefly right greater than left chronic facet degeneration at C2-C3 and ligamentous hypertrophy about the odontoid. Capacious cervical spinal canal by CT, no evidence of spinal stenosis. Upper chest: Thoracic spine is detailed separately. IMPRESSION: 1.  No acute osseous abnormality identified in the cervical spine. 2. Mild for age cervical spine degeneration. 3. Calcified cervical carotid atherosclerosis. Electronically Signed   By: Odessa Fleming M.D.   On: 10/12/2023 09:34  CT HEAD WO CONTRAST ( )  Result Date: 10/12/2023 CLINICAL DATA:  77 year old female with headache for 4 days, neck and back pain.  EXAM: CT HEAD WITHOUT CONTRAST TECHNIQUE: Contiguous axial images were obtained from the base of the skull through the vertex without intravenous contrast. RADIATION DOSE REDUCTION: This exam was performed according to the departmental dose-optimization program which includes automated exposure control, adjustment of the mA and/or kV according to patient size and/or use of iterative reconstruction technique. COMPARISON:  Brain MRI 10/31/2018.  Head CT 10/01/2022. FINDINGS: Brain: Cerebral volume not significantly changed from the 2019 MRI. No midline shift, ventriculomegaly, mass effect, evidence of mass lesion, intracranial hemorrhage or evidence of cortically based acute infarction. Gray-white matter differentiation is within normal limits throughout the brain. Vascular: Calcified atherosclerosis at the skull base. No suspicious intracranial vascular hyperdensity. Skull: Cervical spine detailed separately. No acute osseous abnormality identified. Sinuses/Orbits: Visualized paranasal sinuses and mastoids are stable and well aerated. Other: No acute orbit or scalp soft tissue finding. IMPRESSION: 1.  No acute intracranial abnormality. 2. Stable and negative for age noncontrast CT appearance of the Brain. Electronically Signed   By: Odessa Fleming M.D.   On: 10/12/2023 09:32    Assessment and Plan:   DANALY GANSTER is a 77 y.o. y/o female with dementia and a history of alpha-1 antitrypsin deficiency and also the family states she has been treated in the past with phlebotomy for hemochromatosis.  The patient is now being treated with iron and has been on NSAIDs for her pain.  The patient has been informed that iron supplementation can increase her iron overload in her liver and worsen her cirrhosis.  The patient does not have any signs of a flapping tremor or hepatic encephalopathy and most of her symptoms today are more consistent with dementia.  The patient should be taken off the iron supplementation and the family  has been told to take Tylenol and not NSAIDs for her pain as long as they stick to the amount of Tylenol that is on the dosing instructions.  The patient's family has also been told to keep the patient moving her bowels with 2 formed bowel movements a day without having any diarrhea and to watch for any signs of GI bleeding.  Due to the patient's age of 3 with advanced dementia the family does not want to be aggressive and would like a referral to hospice which will be made today.  The patient and her family have been explained the plan and agree with it.    Midge Minium, MD. Clementeen Graham    Note: This dictation was prepared with Dragon dictation along with smaller phrase technology. Any transcriptional errors that result from this process are unintentional.

## 2023-11-19 ENCOUNTER — Ambulatory Visit: Payer: Medicare HMO | Admitting: Internal Medicine

## 2023-12-08 DEATH — deceased
